# Patient Record
Sex: Male | Born: 1970 | Race: White | Hispanic: No | Marital: Single
Health system: Midwestern US, Community
[De-identification: ages and names within clinical notes are randomized; demographics above are authoritative.]

## PROBLEM LIST (undated history)

## (undated) DIAGNOSIS — R079 Chest pain, unspecified: Secondary | ICD-10-CM

## (undated) DIAGNOSIS — E785 Hyperlipidemia, unspecified: Secondary | ICD-10-CM

## (undated) DIAGNOSIS — J449 Chronic obstructive pulmonary disease, unspecified: Secondary | ICD-10-CM

## (undated) DIAGNOSIS — I10 Essential (primary) hypertension: Secondary | ICD-10-CM

## (undated) DIAGNOSIS — M48061 Spinal stenosis, lumbar region without neurogenic claudication: Secondary | ICD-10-CM

## (undated) DIAGNOSIS — J441 Chronic obstructive pulmonary disease with (acute) exacerbation: Secondary | ICD-10-CM

## (undated) DIAGNOSIS — M5136 Other intervertebral disc degeneration, lumbar region: Secondary | ICD-10-CM

## (undated) DIAGNOSIS — M7989 Other specified soft tissue disorders: Secondary | ICD-10-CM

## (undated) DIAGNOSIS — M5416 Radiculopathy, lumbar region: Secondary | ICD-10-CM

---

## 2014-10-23 ENCOUNTER — Inpatient Hospital Stay: Admit: 2014-10-23 | Primary: Family Medicine

## 2014-10-23 LAB — METABOLIC PANEL, COMPREHENSIVE
A-G Ratio: 1.2 (ref 1.1–2.2)
ALT (SGPT): 32 U/L (ref 12–78)
AST (SGOT): 18 U/L (ref 15–37)
Albumin: 4.3 g/dL (ref 3.5–5.0)
Alk. phosphatase: 86 U/L (ref 45–117)
Anion gap: 7 mmol/L (ref 5–15)
BUN/Creatinine ratio: 15 (ref 12–20)
BUN: 15 MG/DL (ref 6–20)
Bilirubin, total: 0.6 MG/DL (ref 0.2–1.0)
CO2: 24 mmol/L (ref 21–32)
Calcium: 9.4 MG/DL (ref 8.5–10.1)
Chloride: 107 mmol/L (ref 97–108)
Creatinine: 1.02 MG/DL (ref 0.70–1.30)
GFR est AA: >60 mL/min/{1.73_m2} (ref >60)
GFR est non-AA: >60 mL/min/{1.73_m2} (ref >60)
Globulin: 3.5 g/dL (ref 2.0–4.0)
Glucose: 72 mg/dL (ref 65–100)
Potassium: 4.3 mmol/L (ref 3.5–5.1)
Protein, total: 7.8 g/dL (ref 6.4–8.2)
Sodium: 138 mmol/L (ref 136–145)

## 2014-10-23 LAB — CBC WITH AUTOMATED DIFF
ABS. BASOPHILS: 0.1 10*3/uL (ref 0.0–0.1)
ABS. EOSINOPHILS: 0.3 10*3/uL (ref 0.0–0.4)
ABS. LYMPHOCYTES: 2.8 10*3/uL (ref 0.8–3.5)
ABS. MONOCYTES: 0.5 10*3/uL (ref 0.0–1.0)
ABS. NEUTROPHILS: 3.8 10*3/uL (ref 1.8–8.0)
BASOPHILS: 1 % (ref 0–1)
EOSINOPHILS: 4 % (ref 0–7)
HCT: 47.6 % (ref 36.6–50.3)
HGB: 16.1 g/dL (ref 12.1–17.0)
LYMPHOCYTES: 38 % (ref 12–49)
MCH: 32.4 PG (ref 26.0–34.0)
MCHC: 33.8 g/dL (ref 30.0–36.5)
MCV: 95.8 FL (ref 80.0–99.0)
MONOCYTES: 7 % (ref 5–13)
NEUTROPHILS: 50 % (ref 32–75)
PLATELET: 237 10*3/uL (ref 150–400)
RBC: 4.97 M/uL (ref 4.10–5.70)
RDW: 13.2 % (ref 11.5–14.5)
WBC: 7.4 10*3/uL (ref 4.1–11.1)

## 2014-10-23 LAB — LIPID PANEL
CHOL/HDL Ratio: 2.6 (ref 0–5.0)
Cholesterol, total: 118 MG/DL (ref <200)
HDL Cholesterol: 45 MG/DL
LDL, calculated: 60.8 MG/DL (ref 0–100)
Triglyceride: 61 MG/DL (ref <150)
VLDL, calculated: 12.2 MG/DL

## 2014-10-23 LAB — TSH 3RD GENERATION: TSH: 1.31 u[IU]/mL (ref 0.36–3.74)

## 2015-12-23 ENCOUNTER — Encounter

## 2015-12-24 ENCOUNTER — Encounter

## 2016-01-17 ENCOUNTER — Emergency Department: Admit: 2016-01-17 | Payer: Charity | Primary: Family Medicine

## 2016-01-17 ENCOUNTER — Inpatient Hospital Stay
Admit: 2016-01-17 | Discharge: 2016-01-17 | Disposition: A | Discharge status: Home / Self Care | Payer: Charity | Attending: Emergency Medicine

## 2016-01-17 DIAGNOSIS — J441 Chronic obstructive pulmonary disease with (acute) exacerbation: Secondary | ICD-10-CM

## 2016-01-17 LAB — CBC WITH AUTOMATED DIFF
ABS. BASOPHILS: 0.1 10*3/uL (ref 0.0–0.1)
ABS. EOSINOPHILS: 0.5 10*3/uL — ABNORMAL HIGH (ref 0.0–0.4)
ABS. LYMPHOCYTES: 4 10*3/uL — ABNORMAL HIGH (ref 0.8–3.5)
ABS. MONOCYTES: 0.9 10*3/uL (ref 0.0–1.0)
ABS. NEUTROPHILS: 5.3 10*3/uL (ref 1.8–8.0)
BASOPHILS: 1 % (ref 0–1)
EOSINOPHILS: 4 % (ref 0–7)
HCT: 48.3 % (ref 36.6–50.3)
HGB: 16.8 g/dL (ref 12.1–17.0)
LYMPHOCYTES: 37 % (ref 12–49)
MCH: 32.7 PG (ref 26.0–34.0)
MCHC: 34.8 g/dL (ref 30.0–36.5)
MCV: 94.2 FL (ref 80.0–99.0)
MONOCYTES: 8 % (ref 5–13)
NEUTROPHILS: 50 % (ref 32–75)
PLATELET: 200 10*3/uL (ref 150–400)
RBC: 5.13 M/uL (ref 4.10–5.70)
RDW: 13.4 % (ref 11.5–14.5)
WBC: 10.8 10*3/uL (ref 4.1–11.1)

## 2016-01-17 LAB — METABOLIC PANEL, BASIC
Anion gap: 11 mmol/L (ref 5–15)
BUN/Creatinine ratio: 9 — ABNORMAL LOW (ref 12–20)
BUN: 12 MG/DL (ref 6–20)
CO2: 24 mmol/L (ref 21–32)
Calcium: 9 MG/DL (ref 8.5–10.1)
Chloride: 105 mmol/L (ref 97–108)
Creatinine: 1.33 MG/DL — ABNORMAL HIGH (ref 0.70–1.30)
GFR est AA: >60 mL/min/{1.73_m2} (ref >60)
GFR est non-AA: 58 mL/min/{1.73_m2} — ABNORMAL LOW (ref >60)
Glucose: 101 mg/dL — ABNORMAL HIGH (ref 65–100)
Potassium: 3.8 mmol/L (ref 3.5–5.1)
Sodium: 140 mmol/L (ref 136–145)

## 2016-01-17 LAB — TROPONIN I: Troponin-I, Qt.: <0.04 ng/mL (ref <0.05)

## 2016-01-17 MED ORDER — ALBUTEROL SULFATE HFA 90 MCG/ACTUATION AEROSOL INHALER
90 mcg/actuation | RESPIRATORY_TRACT | 0 refills | Status: AC | PRN
Start: 2016-01-17 — End: ?

## 2016-01-17 MED ORDER — AZITHROMYCIN 250 MG TAB
250 mg | ORAL_TABLET | ORAL | 0 refills | Status: AC
Start: 2016-01-17 — End: 2016-01-22

## 2016-01-17 MED ORDER — SODIUM CHLORIDE 0.9 % IJ SYRG
INTRAMUSCULAR | Status: DC | PRN
Start: 2016-01-17 — End: 2016-01-17

## 2016-01-17 MED ORDER — IPRATROPIUM-ALBUTEROL 2.5 MG-0.5 MG/3 ML NEB SOLUTION
2.5 mg-0.5 mg/3 ml | RESPIRATORY_TRACT | Status: AC
Start: 2016-01-17 — End: 2016-01-17
  Administered 2016-01-17: 22:00:00 via RESPIRATORY_TRACT

## 2016-01-17 MED ORDER — PREDNISONE 20 MG TAB
20 mg | ORAL_TABLET | Freq: Every day | ORAL | 0 refills | Status: AC
Start: 2016-01-17 — End: 2016-01-22

## 2016-01-17 MED ORDER — IPRATROPIUM-ALBUTEROL 2.5 MG-0.5 MG/3 ML NEB SOLUTION
2.5 mg-0.5 mg/3 ml | RESPIRATORY_TRACT | Status: AC
Start: 2016-01-17 — End: 2016-01-17
  Administered 2016-01-17: 23:00:00 via RESPIRATORY_TRACT

## 2016-01-17 MED ORDER — SODIUM CHLORIDE 0.9 % IJ SYRG
Freq: Three times a day (TID) | INTRAMUSCULAR | Status: DC
Start: 2016-01-17 — End: 2016-01-17

## 2016-01-17 MED ORDER — METHYLPREDNISOLONE (PF) 125 MG/2 ML IJ SOLR
125 mg/2 mL | INTRAMUSCULAR | Status: AC
Start: 2016-01-17 — End: 2016-01-17
  Administered 2016-01-17: 23:00:00 via INTRAVENOUS

## 2016-01-17 MED FILL — IPRATROPIUM-ALBUTEROL 2.5 MG-0.5 MG/3 ML NEB SOLUTION: 2.5 mg-0.5 mg/3 ml | RESPIRATORY_TRACT | Qty: 3

## 2016-01-17 MED FILL — SOLU-MEDROL (PF) 125 MG/2 ML SOLUTION FOR INJECTION: 125 mg/2 mL | INTRAMUSCULAR | Qty: 2

## 2016-01-17 MED FILL — BD POSIFLUSH NORMAL SALINE 0.9 % INJECTION SYRINGE: INTRAMUSCULAR | Qty: 10

## 2016-01-17 NOTE — ED Provider Notes (Signed)
HPI Comments: 45 y.o. male with no significant past medical history who presents to the ED via EMS with chief complaint of chest pain. Pt reports intermittent chest tightness accompanied by cough and SOB onset about 3 weeks ago. Pt states his chest pain is exacerbated by palpation. Pt states he was seen by his PCP today about starting Chantix and was referred to the ED for further evaluation due to EKG changes. Pt denies any known pulmonary or cardiac hx but says his father has hx of heart disease. Pt denies fever, chills, nausea, or vomiting. There are no other acute medical complaints voiced at this time.    Social Hx: Smoker.     PCP: No primary care provider on file.    Note written by Veto Kemps, Scribe, as dictated by Lauris Chroman, MD 5:20 PM     The history is provided by the patient.        No past medical history on file.    No past surgical history on file.      No family history on file.    Social History     Social History   ??? Marital status: N/A     Spouse name: N/A   ??? Number of children: N/A   ??? Years of education: N/A     Occupational History   ??? Not on file.     Social History Main Topics   ??? Smoking status: Not on file   ??? Smokeless tobacco: Not on file   ??? Alcohol use Not on file   ??? Drug use: Not on file   ??? Sexual activity: Not on file     Other Topics Concern   ??? Not on file     Social History Narrative         ALLERGIES: Review of patient's allergies indicates no known allergies.    Review of Systems   Constitutional: Negative.  Negative for appetite change, chills, fever and unexpected weight change.   HENT: Negative.  Negative for ear pain, hearing loss, nosebleeds, rhinorrhea, sore throat and trouble swallowing.    Respiratory: Positive for cough and shortness of breath.    Cardiovascular: Positive for chest pain. Negative for palpitations.   Gastrointestinal: Negative.  Negative for abdominal distention, abdominal pain, blood in stool, nausea and vomiting.    Endocrine: Negative.    Genitourinary: Negative for dysuria and hematuria.   Musculoskeletal: Negative.  Negative for back pain and myalgias.   Skin: Negative.  Negative for rash.   Allergic/Immunologic: Negative.    Neurological: Negative.  Negative for dizziness, syncope, weakness and numbness.   Hematological: Negative.    Psychiatric/Behavioral: Negative.    All other systems reviewed and are negative.      Vitals:    01/17/16 1708 01/17/16 1715   BP: 132/78 129/86   Pulse: 89 (!) 101   Resp: 18 12   Temp: 98.6 ??F (37 ??C)    SpO2: 98% 99%   Weight: 72.6 kg (160 lb)    Height:  (1.727 m)             Physical Exam   Constitutional: He is oriented to person, place, and time. He appears well-developed and well-nourished. No distress.   HENT:   Head: Normocephalic and atraumatic.   Right Ear: External ear normal.   Left Ear: External ear normal.   Nose: Nose normal.   Mouth/Throat: Oropharynx is clear and moist.   Eyes: Conjunctivae and EOM  are normal. Pupils are equal, round, and reactive to light.   Neck: Normal range of motion. Neck supple. No JVD present. No thyromegaly present.   Cardiovascular: Normal rate, regular rhythm, normal heart sounds and intact distal pulses.    No murmur heard.  Pulmonary/Chest: No accessory muscle usage. He is in respiratory distress. He has wheezes. He has no rales.   Frequent cough.  Mild respiratory distress.  Scant wheezing with forced expiration only.  Good air excursion.  No accessory muscle use.  No rales.   Abdominal: Soft. Bowel sounds are normal. He exhibits no distension. There is no tenderness.   Musculoskeletal: Normal range of motion. He exhibits no edema.   Neurological: He is alert and oriented to person, place, and time. No cranial nerve deficit.   Skin: Skin is warm and dry. No rash noted. Nails show clubbing.   Nails show significant clubbing.   Psychiatric: He has a normal mood and affect. His behavior is normal. Thought content normal.    Nursing note and vitals reviewed.     Note written by Veto KempsSamantha L. Barock, Scribe, as dictated by Lauris ChromanParas K Jesusmanuel Erbes, MD 5:21 PM    MDM  ED Course       Procedures    ED EKG interpretation:  Rhythm: normal sinus rhythm; and regular . Rate (approx.): 83; Axis: normal; ST/T wave: normal; no ectopy.      Note written by Veto KempsSamantha L. Barock, Scribe, as dictated by Lauris ChromanParas K Karlen Barbar, MD 5:09 PM    PROGRESS NOTE:  6:31 PM   Troponin negative. Chemistry unremarkable. CBC normal. Chest x-ray findings consistent with COPD. No airspace disease.     A/P: COPD exacerbation. Will discharge home with inhalers, steroids, and Z pack.

## 2016-01-17 NOTE — ED Notes (Signed)
Patient stated "i thought i had pneumonia", c/o SOB.

## 2016-01-17 NOTE — ED Notes (Signed)
Discharged by Provider.

## 2016-01-17 NOTE — ED Triage Notes (Signed)
Pt arrives via EMS with chest pain and coughing x2-3 weeks. Reports "I feel like something is sitting on my chest." 4 baby asa, 1 atrovent, and 2 albuterol given PTA by EMS.

## 2016-05-19 ENCOUNTER — Inpatient Hospital Stay: Admit: 2016-05-19 | Payer: Charity | Primary: Family Medicine

## 2016-05-19 LAB — LIPID PANEL
CHOL/HDL Ratio: 4.2 (ref 0–5.0)
Cholesterol, total: 144 MG/DL (ref <200)
HDL Cholesterol: 34 MG/DL
LDL, calculated: 65.8 MG/DL (ref 0–100)
Triglyceride: 221 MG/DL — ABNORMAL HIGH (ref <150)
VLDL, calculated: 44.2 MG/DL

## 2016-05-19 LAB — METABOLIC PANEL, COMPREHENSIVE
A-G Ratio: 1.1 (ref 1.1–2.2)
ALT (SGPT): 34 U/L (ref 12–78)
AST (SGOT): 14 U/L — ABNORMAL LOW (ref 15–37)
Albumin: 3.7 g/dL (ref 3.5–5.0)
Alk. phosphatase: 90 U/L (ref 45–117)
Anion gap: 10 mmol/L (ref 5–15)
BUN/Creatinine ratio: 9 — ABNORMAL LOW (ref 12–20)
BUN: 10 MG/DL (ref 6–20)
Bilirubin, total: 0.3 MG/DL (ref 0.2–1.0)
CO2: 23 mmol/L (ref 21–32)
Calcium: 9.1 MG/DL (ref 8.5–10.1)
Chloride: 106 mmol/L (ref 97–108)
Creatinine: 1.09 MG/DL (ref 0.70–1.30)
GFR est AA: >60 mL/min/{1.73_m2} (ref >60)
GFR est non-AA: >60 mL/min/{1.73_m2} (ref >60)
Globulin: 3.5 g/dL (ref 2.0–4.0)
Glucose: 116 mg/dL — ABNORMAL HIGH (ref 65–100)
Potassium: 4.1 mmol/L (ref 3.5–5.1)
Protein, total: 7.2 g/dL (ref 6.4–8.2)
Sodium: 139 mmol/L (ref 136–145)

## 2016-07-20 ENCOUNTER — Encounter

## 2017-06-01 ENCOUNTER — Inpatient Hospital Stay: Admit: 2017-06-01 | Payer: Self-pay | Primary: Family Medicine

## 2017-06-01 LAB — METABOLIC PANEL, COMPREHENSIVE
A-G Ratio: 1 — ABNORMAL LOW (ref 1.1–2.2)
ALT (SGPT): 81 U/L — ABNORMAL HIGH (ref 12–78)
AST (SGOT): 32 U/L (ref 15–37)
Albumin: 3.8 g/dL (ref 3.5–5.0)
Alk. phosphatase: 101 U/L (ref 45–117)
Anion gap: 9 mmol/L (ref 5–15)
BUN/Creatinine ratio: 5 — ABNORMAL LOW (ref 12–20)
BUN: 6 MG/DL (ref 6–20)
Bilirubin, total: 0.6 MG/DL (ref 0.2–1.0)
CO2: 25 mmol/L (ref 21–32)
Calcium: 8.9 MG/DL (ref 8.5–10.1)
Chloride: 107 mmol/L (ref 97–108)
Creatinine: 1.1 MG/DL (ref 0.70–1.30)
GFR est AA: >60 mL/min/{1.73_m2} (ref >60)
GFR est non-AA: >60 mL/min/{1.73_m2} (ref >60)
Globulin: 4 g/dL (ref 2.0–4.0)
Glucose: 90 mg/dL (ref 65–100)
Potassium: 4.5 mmol/L (ref 3.5–5.1)
Protein, total: 7.8 g/dL (ref 6.4–8.2)
Sodium: 141 mmol/L (ref 136–145)

## 2017-06-01 LAB — CBC WITH AUTOMATED DIFF
ABS. BASOPHILS: 0.1 10*3/uL (ref 0.0–0.1)
ABS. EOSINOPHILS: 0.4 10*3/uL (ref 0.0–0.4)
ABS. IMM. GRANS.: 0 10*3/uL (ref 0.00–0.04)
ABS. LYMPHOCYTES: 3 10*3/uL (ref 0.8–3.5)
ABS. MONOCYTES: 0.6 10*3/uL (ref 0.0–1.0)
ABS. NEUTROPHILS: 3.7 10*3/uL (ref 1.8–8.0)
ABSOLUTE NRBC: 0 10*3/uL (ref 0.00–0.01)
BASOPHILS: 1 % (ref 0–1)
EOSINOPHILS: 5 % (ref 0–7)
HCT: 53.6 % — ABNORMAL HIGH (ref 36.6–50.3)
HGB: 18 g/dL — ABNORMAL HIGH (ref 12.1–17.0)
IMMATURE GRANULOCYTES: 0 % (ref 0.0–0.5)
LYMPHOCYTES: 38 % (ref 12–49)
MCH: 32.7 PG (ref 26.0–34.0)
MCHC: 33.6 g/dL (ref 30.0–36.5)
MCV: 97.5 FL (ref 80.0–99.0)
MONOCYTES: 7 % (ref 5–13)
MPV: 12.3 FL (ref 8.9–12.9)
NEUTROPHILS: 48 % (ref 32–75)
NRBC: 0 PER 100 WBC
PLATELET: 180 10*3/uL (ref 150–400)
RBC: 5.5 M/uL (ref 4.10–5.70)
RDW: 13.3 % (ref 11.5–14.5)
WBC: 7.7 10*3/uL (ref 4.1–11.1)

## 2017-06-01 LAB — LIPID PANEL
CHOL/HDL Ratio: 5.5 — ABNORMAL HIGH (ref 0–5.0)
Cholesterol, total: 192 MG/DL (ref <200)
HDL Cholesterol: 35 MG/DL
LDL, calculated: 114.8 MG/DL — ABNORMAL HIGH (ref 0–100)
Triglyceride: 211 MG/DL — ABNORMAL HIGH (ref <150)
VLDL, calculated: 42.2 MG/DL

## 2017-06-01 LAB — VITAMIN D, 25 HYDROXY: Vitamin D 25-Hydroxy: 13.6 ng/mL — ABNORMAL LOW (ref 30–100)

## 2017-06-01 LAB — HEMOGLOBIN A1C WITH EAG
Est. average glucose: 120 mg/dL
Hemoglobin A1c: 5.8 % (ref 4.2–6.3)

## 2017-06-01 LAB — TSH 3RD GENERATION: TSH: 2.38 u[IU]/mL (ref 0.36–3.74)

## 2017-07-12 ENCOUNTER — Inpatient Hospital Stay: Admit: 2017-07-13 | Payer: Self-pay | Primary: Family Medicine

## 2017-07-13 LAB — METABOLIC PANEL, COMPREHENSIVE
A-G Ratio: 1 — ABNORMAL LOW (ref 1.1–2.2)
ALT (SGPT): 77 U/L (ref 12–78)
AST (SGOT): 26 U/L (ref 15–37)
Albumin: 3.6 g/dL (ref 3.5–5.0)
Alk. phosphatase: 95 U/L (ref 45–117)
Anion gap: 7 mmol/L (ref 5–15)
BUN/Creatinine ratio: 9 — ABNORMAL LOW (ref 12–20)
BUN: 9 MG/DL (ref 6–20)
Bilirubin, total: 0.4 MG/DL (ref 0.2–1.0)
CO2: 25 mmol/L (ref 21–32)
Calcium: 9 MG/DL (ref 8.5–10.1)
Chloride: 109 mmol/L — ABNORMAL HIGH (ref 97–108)
Creatinine: 1.03 MG/DL (ref 0.70–1.30)
GFR est AA: >60 mL/min/{1.73_m2} (ref >60)
GFR est non-AA: >60 mL/min/{1.73_m2} (ref >60)
Globulin: 3.7 g/dL (ref 2.0–4.0)
Glucose: 109 mg/dL — ABNORMAL HIGH (ref 65–100)
Potassium: 4 mmol/L (ref 3.5–5.1)
Protein, total: 7.3 g/dL (ref 6.4–8.2)
Sodium: 141 mmol/L (ref 136–145)

## 2017-07-13 LAB — CBC WITH AUTOMATED DIFF
ABS. BASOPHILS: 0.1 10*3/uL (ref 0.0–0.1)
ABS. EOSINOPHILS: 0.3 10*3/uL (ref 0.0–0.4)
ABS. IMM. GRANS.: 0 10*3/uL (ref 0.00–0.04)
ABS. LYMPHOCYTES: 3.5 10*3/uL (ref 0.8–3.5)
ABS. MONOCYTES: 0.9 10*3/uL (ref 0.0–1.0)
ABS. NEUTROPHILS: 4.6 10*3/uL (ref 1.8–8.0)
ABSOLUTE NRBC: 0.02 10*3/uL — ABNORMAL HIGH (ref 0.00–0.01)
BASOPHILS: 1 % (ref 0–1)
EOSINOPHILS: 3 % (ref 0–7)
HCT: 53.9 % — ABNORMAL HIGH (ref 36.6–50.3)
HGB: 17.8 g/dL — ABNORMAL HIGH (ref 12.1–17.0)
IMMATURE GRANULOCYTES: 0 % (ref 0.0–0.5)
LYMPHOCYTES: 37 % (ref 12–49)
MCH: 32.2 PG (ref 26.0–34.0)
MCHC: 33 g/dL (ref 30.0–36.5)
MCV: 97.5 FL (ref 80.0–99.0)
MONOCYTES: 10 % (ref 5–13)
MPV: 12.6 FL (ref 8.9–12.9)
NEUTROPHILS: 49 % (ref 32–75)
NRBC: 0.2 PER 100 WBC — ABNORMAL HIGH
PLATELET: 200 10*3/uL (ref 150–400)
RBC: 5.53 M/uL (ref 4.10–5.70)
RDW: 13.4 % (ref 11.5–14.5)
WBC: 9.6 10*3/uL (ref 4.1–11.1)

## 2017-07-13 LAB — FERRITIN: Ferritin: 62 NG/ML (ref 26–388)

## 2017-07-14 LAB — ERYTHROPOIETIN: Erythropoietin: 10.2 m[IU]/mL (ref 2.6–18.5)

## 2017-11-09 ENCOUNTER — Ambulatory Visit
Admit: 2017-11-09 | Discharge: 2017-11-09 | Payer: PRIVATE HEALTH INSURANCE | Attending: Family Medicine | Primary: Family Medicine

## 2017-11-09 DIAGNOSIS — E7800 Pure hypercholesterolemia, unspecified: Secondary | ICD-10-CM

## 2017-11-09 NOTE — Progress Notes (Signed)
Subjective:      Geoffrey Murphy is a 47 y.o. male with h/o hyperlipidemia, COPD, GERD, chronic low back pain, and depression here to establish care. Transferring care from [facility]. He has lab form with requested labs.     COPD: managed by Pulmonology and next appointment 12/18/17. No recent ED visits or hospitalizations. Reports compliance with medical therapy.     Vitamin D deficiency: has completed high dose supplementation about 1 month ago. He has not been taking a daily supplement.   GERD: stable with use of omeprazole PRN. Denies hematemesis, melena, hematochezia.   Exertional chest pain: has noticed episodes over the last several months. Improved with rest and will resolve within 2-3 minutes. Pain is located in the left chest and is described as "someone sticking something in my heart", sharp in quality. Non-radiating. Episodes not associated with lightheadedness, dizziness, nausea, vomiting, diaphoresis. Episodes are increasing in frequency. Family h/o notable for MI in his father. He has h/o high cholesterol and currently on statin therapy.     Patient Care Team:  Other, Phys, MD as PCP - General  Concha Pyo, MD as Physician (Psychiatry)  McFarling, Otis Dials, NP as Nurse Practitioner (Pulmonary Disease)  Ludwig Lean, MD as Physician (Orthopedic Surgery)      Current Outpatient Medications   Medication Sig Dispense Refill   ??? albuterol (PROVENTIL VENTOLIN) 2.5 mg /3 mL (0.083 %) nebulizer solution Take 3 mL by inhalation two (2) times daily as needed.     ??? omeprazole (PRILOSEC) 20 mg capsule Take 1 Cap by mouth daily.     ??? buPROPion SR (WELLBUTRIN SR) 150 mg SR tablet Take 1 Tab by mouth two (2) times a day.     ??? atorvastatin (LIPITOR) 10 mg tablet Take 1 Tab by mouth daily.     ??? gabapentin (NEURONTIN) 300 mg capsule Take 1 Cap by mouth three (3) times daily.     ??? methocarbamol (ROBAXIN) 500 mg tablet Take 1.5 Tabs by mouth two (2) times daily as needed.      ??? mirtazapine (REMERON) 15 mg tablet Take 1 Tab by mouth daily.     ??? meloxicam (MOBIC) 15 mg tablet Take 1 Tab by mouth daily.     ??? ergocalciferol (VITAMIN D2) 50,000 unit capsule Take 50,000 Units by mouth every seven (7) days.     ??? aspirin delayed-release 81 mg tablet Take 81 mg by mouth daily.     ??? albuterol (VENTOLIN HFA) 90 mcg/actuation inhaler Take 2 Puffs by inhalation every six (6) hours as needed for Wheezing.     ??? tiotropium-olodaterol (STIOLTO RESPIMAT) 2.5-2.5 mcg/actuation inhaler Take 1 Puff by inhalation daily.     ??? albuterol (PROAIR HFA) 90 mcg/actuation inhaler Take 2 Puffs by inhalation every four (4) hours as needed for Wheezing. 1 Inhaler 0       No Known Allergies    Past medical history - reviewed.   Past Medical History:   Diagnosis Date   ??? Chronic obstructive pulmonary disease ([place])    ??? Chronic pain     back pain   ??? Depression    ??? GERD (gastroesophageal reflux disease)    ??? Hypercholesterolemia        Social history - reviewed.   Social History     Tobacco Use   ??? Smoking status: Former Smoker     Types: Cigarettes     Last attempt to quit: 09/12/2016     Years since  quitting: 1.1   ??? Smokeless tobacco: Never Used   Substance Use Topics   ??? Alcohol use: Yes     Frequency: Monthly or less     Drinks per session: 1 or 2     Binge frequency: Never        Family history - reviewed.   Family History   Problem Relation Age of Onset   ??? Hypertension Mother    ??? Heart Attack Father        Review of Systems, in addition to HPI;   Constitutional: negative for fevers and chills  Eyes: negative for visual disturbance and irritation  Ears, nose, mouth, throat, and face: negative for nasal congestion and sore throat  Gastrointestinal: negative for nausea, vomiting, change in bowel habits and abdominal pain  Genitourinary:negative for frequency, dysuria and hematuria  Musculoskeletal: positive for arthralgias  Neurological: negative for headaches, dizziness       Objective:     Vitals:     11/09/17 1058 11/09/17 1158   BP: (!) 142/98  Comment: Manual (!) 128/92   BP 1 Location: Right arm Right arm   BP Patient Position: Sitting Sitting   Pulse: 83    Resp: 18    Temp: 98.2 ??F (36.8 ??C)  Comment: .    TempSrc: Oral    SpO2: 96%    Weight: 185 lb (83.9 kg)    Height: '5\' 8"'  (1.727 m)         General appearance - alert, well appearing, and in no distress  Eyes - pupils equal and reactive, extraocular eye movements intact, sclera anicteric  Oropharyngx - mucous membranes moist, pharynx normal without lesions  Neck - supple, no significant adenopathy, carotids upstroke normal bilaterally, no bruits  Chest - wheezing noted, no rales or rhonchi, symmetric air entry, no tachypnea, retractions or cyanosis  Heart - normal rate, regular rhythm, normal S1, S2, no murmurs, rubs, clicks or gallops  Extremities - peripheral pulses normal, no pedal edema, no clubbing or cyanosis  Neurologic - alert, oriented, normal speech, no focal findings or movement disorder noted  Skin - normal coloration and turgor, no rashes  Mental Status - alert, oriented to person, place, and time, normal mood, behavior, speech, dress, motor activity, and thought processes    Lab Results   Component Value Date/Time    Cholesterol, total 192 05/31/2017 11:55 AM    HDL Cholesterol 35 05/31/2017 11:55 AM    LDL, calculated 114.8 (H) 05/31/2017 11:55 AM    VLDL, calculated 42.2 05/31/2017 11:55 AM    Triglyceride 211 (H) 05/31/2017 11:55 AM    CHOL/HDL Ratio 5.5 (H) 05/31/2017 11:55 AM       Lab Results   Component Value Date/Time    Sodium 141 07/12/2017 12:00 PM    Potassium 4.0 07/12/2017 12:00 PM    Chloride 109 (H) 07/12/2017 12:00 PM    CO2 25 07/12/2017 12:00 PM    Anion gap 7 07/12/2017 12:00 PM    Glucose 109 (H) 07/12/2017 12:00 PM    BUN 9 07/12/2017 12:00 PM    Creatinine 1.03 07/12/2017 12:00 PM    BUN/Creatinine ratio 9 (L) 07/12/2017 12:00 PM    GFR est AA >60 07/12/2017 12:00 PM    GFR est non-AA >60 07/12/2017 12:00 PM     Calcium 9.0 07/12/2017 12:00 PM    Bilirubin, total 0.4 07/12/2017 12:00 PM    AST (SGOT) 26 07/12/2017 12:00 PM    Alk. phosphatase 95 07/12/2017  12:00 PM    Protein, total 7.3 07/12/2017 12:00 PM    Albumin 3.6 07/12/2017 12:00 PM    Globulin 3.7 07/12/2017 12:00 PM    A-G Ratio 1.0 (L) 07/12/2017 12:00 PM    ALT (SGPT) 77 07/12/2017 12:00 PM       Lab Results   Component Value Date/Time    WBC 9.6 07/12/2017 12:00 PM    HGB 17.8 (H) 07/12/2017 12:00 PM    HCT 53.9 (H) 07/12/2017 12:00 PM    PLATELET 200 07/12/2017 12:00 PM    MCV 97.5 07/12/2017 12:00 PM       Lab Results   Component Value Date/Time    Vitamin D 25-Hydroxy 13.6 (L) 05/31/2017 11:55 AM       Assessment/Plan:   Geoffrey Murphy is a 47 y.o. male seen for:     1. Hypercholesterolemia: check labs. Continue with statin therapy.   - LIPID PANEL  - HEMOGLOBIN A1C WITH EAG  - METABOLIC PANEL, COMPREHENSIVE    2. Gastroesophageal reflux disease, esophagitis presence not specified: controlled, continue with PRN use of omeprazole.     3. Chronic obstructive pulmonary disease, unspecified COPD type [facility]): managed by Pulmonology.   - CBC WITH AUTOMATED DIFF    4. Exertional chest pain: check labs. Given history and family h/o CAD, recommend cardiology evaluation.   - CBC WITH AUTOMATED DIFF  - TSH AND FREE T4  - REFERRAL TO CARDIOLOGY    5. BMI 28.0-28.9,adult: I have reviewed/discussed the above normal BMI with the patient.  I have recommended the following interventions: dietary management education, guidance, and counseling and encourage exercise.     6. Vitamin D deficiency: s/p high dose supplementation. Check level.   - VITAMIN D, 25 HYDROXY    7. Encounter to establish care: previous medical records requested.     I have discussed the diagnosis with the patient and the intended plan as seen in the above orders. The patient has received an after-visit summary and questions were answered concerning future plans. I have discussed  medication side effects and warnings with the patient as well. Patient verbalizes understanding of plan of care and denies further questions or concerns at this time. Informed patient to return to the office if symptoms worsen or if new symptoms arise.       Follow-up and Dispositions    ?? Return in about 4 months (around 03/11/2018) for follow up or sooner as needed.

## 2017-11-09 NOTE — Patient Instructions (Signed)
A Healthy Lifestyle: Care Instructions  Your Care Instructions    A healthy lifestyle can help you feel good, stay at a healthy weight, and have plenty of energy for both work and play. A healthy lifestyle is something you can share with your whole family.  A healthy lifestyle also can lower your risk for serious health problems, such as high blood pressure, heart disease, and diabetes.  You can follow a few steps listed below to improve your health and the health of your family.  Follow-up care is a key part of your treatment and safety. Be sure to make and go to all appointments, and call your doctor if you are having problems. It's also a good idea to know your test results and keep a list of the medicines you take.  How can you care for yourself at home?  ?? Do not eat too much sugar, fat, or fast foods. You can still have dessert and treats now and then. The goal is moderation.  ?? Start small to improve your eating habits. Pay attention to portion sizes, drink less juice and soda pop, and eat more fruits and vegetables.  ? Eat a healthy amount of food. A 3-ounce serving of meat, for example, is about the size of a deck of cards. Fill the rest of your plate with vegetables and whole grains.  ? Limit the amount of soda and sports drinks you have every day. Drink more water when you are thirsty.  ? Eat at least 5 servings of fruits and vegetables every day. It may seem like a lot, but it is not hard to reach this goal. A serving or helping is 1 piece of fruit, 1 cup of vegetables, or 2 cups of leafy, raw vegetables. Have an apple or some carrot sticks as an afternoon snack instead of a candy bar. Try to have fruits and/or vegetables at every meal.  ?? Make exercise part of your daily routine. You may want to start with simple activities, such as walking, bicycling, or slow swimming. Try to be active 30 to 60 minutes every day. You do not need to do all 30 to 60  minutes all at once. For example, you can exercise 3 times a day for 10 or 20 minutes. Moderate exercise is safe for most people, but it is always a good idea to talk to your doctor before starting an exercise program.  ?? Keep moving. Mow the lawn, work in the garden, or clean your house. Take the stairs instead of the elevator at work.  ?? If you smoke, quit. People who smoke have an increased risk for heart attack, stroke, cancer, and other lung illnesses. Quitting is hard, but there are ways to boost your chance of quitting tobacco for good.  ? Use nicotine gum, patches, or lozenges.  ? Ask your doctor about stop-smoking programs and medicines.  ? Keep trying.  In addition to reducing your risk of diseases in the future, you will notice some benefits soon after you stop using tobacco. If you have shortness of breath or asthma symptoms, they will likely get better within a few weeks after you quit.  ?? Limit how much alcohol you drink. Moderate amounts of alcohol (up to 2 drinks a day for men, 1 drink a day for women) are okay. But drinking too much can lead to liver problems, high blood pressure, and other health problems.  Family health  If you have a family, there are many things you   can do together to improve your health.  ?? Eat meals together as a family as often as possible.  ?? Eat healthy foods. This includes fruits, vegetables, lean meats and dairy, and whole grains.  ?? Include your family in your fitness plan. Most people think of activities such as jogging or tennis as the way to fitness, but there are many ways you and your family can be more active. Anything that makes you breathe hard and gets your heart pumping is exercise. Here are some tips:  ? Walk to do errands or to take your child to school or the bus.  ? Go for a family bike ride after dinner instead of watching TV.  Where can you learn more?  Go to http://www.healthwise.net/[employer].   Enter U807 in the search box to learn more about "A Healthy Lifestyle: Care Instructions."  Current as of: April 17, 2017  Content Version: 11.9  ?? 2006-2018 [employer]. Care instructions adapted under license by [employer] (which disclaims liability or warranty for this information). If you have questions about a medical condition or this instruction, always ask your healthcare professional. Healthwise, Inc[employer] disclaims any warranty or liability for your use of this information.

## 2017-11-09 NOTE — Progress Notes (Signed)
Identified pt with two pt identifiers(name and DOB).    Chief Complaint   Patient presents with   ??? Establish Care     Former PCP [facility].    ??? Labs     Patient was [facility]. would like to have them done here now.         Health Maintenance Due   Topic   ??? DTaP/Tdap/Td series (1 - Tdap)       Wt Readings from Last 3 Encounters:   11/09/17 185 lb (83.9 kg)   01/17/16 160 lb (72.6 kg)     Temp Readings from Last 3 Encounters:   11/09/17 98.2 ??F (36.8 ??C) (Oral)   01/17/16 98.6 ??F (37 ??C)     BP Readings from Last 3 Encounters:   11/09/17 (!) 128/92   01/17/16 122/62     Pulse Readings from Last 3 Encounters:   11/09/17 83   01/17/16 84         Learning Assessment:  :     Learning Assessment 11/09/2017   PRIMARY LEARNER Patient   PRIMARY LANGUAGE ENGLISH   LEARNER PREFERENCE PRIMARY DEMONSTRATION   ANSWERED BY self   RELATIONSHIP SELF       Depression Screening:  :     3 most recent PHQ Screens 11/09/2017   Little interest or pleasure in doing things Not at all   Feeling down, depressed, irritable, or hopeless Not at all   Total Score PHQ 2 0       Fall Risk Assessment:  :     No flowsheet data found.    Abuse Screening:  :     Abuse Screening Questionnaire 11/09/2017   Do you ever feel afraid of your partner? N   Are you in a relationship with someone who physically or mentally threatens you? N   Is it safe for you to go home? Y       Coordination of Care Questionnaire:  :     1) Have you been to an emergency room, urgent care clinic since your last visit? no   Hospitalized since your last visit? no             2) Have you seen or consulted any other health care providers outside of [facility] since your last visit? yes  (Include any pap smears or colon screenings in this section.)    3) Do you have an Advance Directive on file? no  Are you interested in receiving information about Advance Directives? no     Reviewed record in preparation for visit and have obtained necessary documentation.  Medication reconciliation up to date and corrected with patient at this time.

## 2017-11-11 LAB — CBC WITH AUTOMATED DIFF
ABS. BASOPHILS: 0.1 10*3/uL (ref 0.0–0.2)
ABS. EOSINOPHILS: 0.4 10*3/uL (ref 0.0–0.4)
ABS. IMM. GRANS.: 0 10*3/uL (ref 0.0–0.1)
ABS. MONOCYTES: 0.9 10*3/uL (ref 0.1–0.9)
ABS. NEUTROPHILS: 5.5 10*3/uL (ref 1.4–7.0)
Abs Lymphocytes: 3.4 10*3/uL — ABNORMAL HIGH (ref 0.7–3.1)
BASOPHILS: 1 %
EOSINOPHILS: 4 %
HCT: 50.4 % (ref 37.5–51.0)
HGB: 17.4 g/dL (ref 13.0–17.7)
IMMATURE GRANULOCYTES: 0 %
Lymphocytes: 33 %
MCH: 33.2 pg — ABNORMAL HIGH (ref 26.6–33.0)
MCHC: 34.5 g/dL (ref 31.5–35.7)
MCV: 96 fL (ref 79–97)
MONOCYTES: 8 %
NEUTROPHILS: 54 %
PLATELET: 231 10*3/uL (ref 150–379)
RBC: 5.24 x10E6/uL (ref 4.14–5.80)
RDW: 14 % (ref 12.3–15.4)
WBC: 10.2 10*3/uL (ref 3.4–10.8)

## 2017-11-11 LAB — HEMOGLOBIN A1C WITH EAG
Estimated average glucose: 123 mg/dL
Hemoglobin A1c: 5.9 % — ABNORMAL HIGH (ref 4.8–5.6)

## 2017-11-11 LAB — METABOLIC PANEL, COMPREHENSIVE
A-G Ratio: 1.5 (ref 1.2–2.2)
ALT (SGPT): 84 IU/L — ABNORMAL HIGH (ref 0–44)
AST (SGOT): 35 IU/L (ref 0–40)
Albumin: 4.4 g/dL (ref 3.5–5.5)
Alk. phosphatase: 90 IU/L (ref 39–117)
BUN/Creatinine ratio: 8 — ABNORMAL LOW (ref 9–20)
BUN: 7 mg/dL (ref 6–24)
Bilirubin, total: 0.4 mg/dL (ref 0.0–1.2)
CO2: 20 mmol/L (ref 20–29)
Calcium: 9.5 mg/dL (ref 8.7–10.2)
Chloride: 102 mmol/L (ref 96–106)
Creatinine: 0.87 mg/dL (ref 0.76–1.27)
GFR est AA: 119 mL/min/{1.73_m2} (ref >59)
GFR est non-AA: 103 mL/min/{1.73_m2} (ref >59)
GLOBULIN, TOTAL: 3 g/dL (ref 1.5–4.5)
Glucose: 84 mg/dL (ref 65–99)
Potassium: 4.6 mmol/L (ref 3.5–5.2)
Protein, total: 7.4 g/dL (ref 6.0–8.5)
Sodium: 140 mmol/L (ref 134–144)

## 2017-11-11 LAB — TSH AND FREE T4
T4, Free: 1.26 ng/dL (ref 0.82–1.77)
TSH: 1.57 u[IU]/mL (ref 0.450–4.500)

## 2017-11-11 LAB — LIPID PANEL
Cholesterol, total: 177 mg/dL (ref 100–199)
HDL Cholesterol: 31 mg/dL — ABNORMAL LOW (ref >39)
LDL, calculated: 98 mg/dL (ref 0–99)
Triglyceride: 238 mg/dL — ABNORMAL HIGH (ref 0–149)
VLDL, calculated: 48 mg/dL — ABNORMAL HIGH (ref 5–40)

## 2017-11-11 LAB — VITAMIN D, 25 HYDROXY: VITAMIN D, 25-HYDROXY: 13.5 ng/mL — ABNORMAL LOW (ref 30.0–100.0)

## 2017-11-11 LAB — CVD REPORT

## 2017-11-12 ENCOUNTER — Encounter: Attending: Internal Medicine | Primary: Family Medicine

## 2017-11-29 ENCOUNTER — Ambulatory Visit
Admit: 2017-11-29 | Discharge: 2017-11-29 | Payer: PRIVATE HEALTH INSURANCE | Attending: Internal Medicine | Primary: Family Medicine

## 2017-11-29 DIAGNOSIS — R079 Chest pain, unspecified: Secondary | ICD-10-CM

## 2017-11-29 NOTE — Progress Notes (Signed)
Orders for echo, lexiscan/cardiolite per Dr. Leron CroakPrabhakar's VO.  Dx: CP on exertion, DOE  Patient was advised to take all his inhalers/pulmonary medications prior to the stress test.

## 2017-11-29 NOTE — Progress Notes (Signed)
Geoffrey Few, MD    [address] [place], [place] [address]    Office [phone]  Pager [phone]    Geoffrey Murphy is a 47 y.o. male referred for evaluation of chest pain. Consult requested by Dara Lords, MD      Primary care physician:  Dara Lords, MD      Chief complaint:  Geoffrey Murphy is a 47 y.o. male who complains of   Chief Complaint   Patient presents with   ??? New Patient   ??? Chest Pain (Angina)     on exertion         I had the pleasure of seeing Geoffrey Murphy in the office today.        Assessment:  Chest pain.  Dyspnea  Hyperlipidemia  Anxiety depression  History of COPD       Plan:   Lexi nuclear study.  Patient was advised to take all his inhalers/pulmonary medications prior to the stress test.  Echocardiogram.  Currently not on blood pressure medications.  Blood pressure mildly elevated.  Could be secondary to his underlying chronic pain.  Followed by pulmonary associates.  Lipids 11/09/2017 reviewed.  Aggressive cardia vascular risk factor modification.  Follow-up in 4 to 6 weeks or earlier as needed.    Patient understands the plan. All questions were answered to the patient's satisfaction.    Medication Side Effects and Warnings were discussed with patient: yes  Patient Labs were reviewed and or requested:  yes  Patient Past Records were reviewed and or requested: yes    I appreciate the opportunity to be involved in [facility]. Please see note below for details. Please do not hesitate to contact us with questions or concerns.    Geoffrey Few, MD    Cardiac Testing/ Procedures:    A.Cardiac Cath/PCI:    B.ECHO/TEE:    C.StressNuclear/Stress ECHO/Stress test:    D.Vascular:    E. EP:    F. Miscellaneous:    History of present illness:  47 yr old CM with history of COPD, chronic back pain who has been having intermittent chest tightness.  Usually associated with shortness of breath  and coughing probably secondary to his COPD.  No prior history of CAD.  Mild swelling lower extremities.  Has bilateral leg pain secondary to his chronic back pain/disc disease/nerve injury.    Past Medical History:   Diagnosis Date   ??? Chronic obstructive pulmonary disease (HCC)    ??? Chronic pain     back pain   ??? Depression    ??? GERD (gastroesophageal reflux disease)    ??? Hypercholesterolemia       No past surgical history on file.  Family History   Problem Relation Age of Onset   ??? Hypertension Mother    ??? Heart Attack Father       Social History     Tobacco Use   ??? Smoking status: Former Smoker     Types: Cigarettes     Last attempt to quit: 09/12/2016     Years since quitting: 1.2   ??? Smokeless tobacco: Never Used   Substance Use Topics   ??? Alcohol use: Yes     Frequency: Monthly or less     Drinks per session: 1 or 2     Binge frequency: Never   ??? Drug use: Never          Medications before admission:  Current Outpatient Medications   Medication Sig Dispense   ??? albuterol (PROVENTIL VENTOLIN) 2.5 mg /3 mL (0.083 %) nebulizer solution Take 3 mL by inhalation two (2) times daily as needed.    ??? omeprazole (PRILOSEC) 20 mg capsule Take 1 Cap by mouth daily.    ??? buPROPion SR (WELLBUTRIN SR) 150 mg SR tablet Take 1 Tab by mouth two (2) times a day.    ??? atorvastatin (LIPITOR) 10 mg tablet Take 1 Tab by mouth daily.    ??? gabapentin (NEURONTIN) 300 mg capsule Take 1 Cap by mouth three (3) times daily.    ??? methocarbamol (ROBAXIN) 500 mg tablet Take 1.5 Tabs by mouth two (2) times daily as needed.    ??? mirtazapine (REMERON) 15 mg tablet Take 1 Tab by mouth daily.    ??? meloxicam (MOBIC) 15 mg tablet Take 1 Tab by mouth daily.    ??? aspirin delayed-release 81 mg tablet Take 81 mg by mouth daily.    ??? tiotropium-olodaterol (STIOLTO RESPIMAT) 2.5-2.5 mcg/actuation inhaler Take 1 Puff by inhalation daily.    ??? albuterol (PROAIR HFA) 90 mcg/actuation inhaler Take 2 Puffs by  inhalation every four (4) hours as needed for Wheezing. 1 Inhaler   ??? ergocalciferol (VITAMIN D2) 50,000 unit capsule Take 50,000 Units by mouth every seven (7) days.      No current facility-administered medications for this visit.            Review of Systems:  (bold if positive, if negative)    Gen:  Eyes:  ENT:  CVS:  Pulm:  GI:    GU:    MS:   arthritisSkin:  Psych:  , anxietyEndo:    Hem:  Renal:    Neuro:        Physical Exam:  Visit Vitals  BP 142/88 (BP 1 Location: Right arm, BP Patient Position: Sitting)   Pulse 90   Resp 16   Ht 5\' 8"  (1.727 m)   Wt 195 lb (88.5 kg)   SpO2 96%   BMI 29.65 kg/m??          Gen: Well-developed, well-nourished, in no acute distress, uses a cane to walk.  HEENT:  Pink conjunctivae, hearing intact to voice, moist mucous membranes  Neck: Supple,No JVD, No Carotid Bruit, Thyroid- non tender  Resp: No accessory muscle use, Clear breath sounds, No rales or rhonchi  Card: Regular Rate,Rythm,Normal S1, S2, No murmurs, rubs or gallop. No thrills.   Abd:  Soft, non-tender, non-distended, normoactive bowel sounds are present,   MSK: No cyanosis or clubbing  Skin: No rashes or ulcers  Neuro:   moving all four extremities, no focal deficit, follows commands appropriately  Psych:  Good insight, oriented to person, place and time, alert, Nml Affect  LE: No edema  Vascular: Radial Pulses 2+ and symmetric        EKG: Sinus rhythm, normal axis, PR WP      Cxray:    LABS:    No results for input(s): CPK, TROIQ in the last 72 hours.    No lab exists for component: CKQMB, CPKMB    Lab Results   Component Value Date/Time    WBC 10.2 11/09/2017 12:06 PM    HGB 17.4 11/09/2017 12:06 PM    HCT 50.4 11/09/2017 12:06 PM    PLATELET 231 11/09/2017 12:06 PM    MCV 96 11/09/2017 12:06 PM     Lab Results   Component Value Date/Time  Sodium 140 11/09/2017 12:06 PM    Potassium 4.6 11/09/2017 12:06 PM    Chloride 102 11/09/2017 12:06 PM    CO2 20 11/09/2017 12:06 PM    Anion gap 7 07/12/2017 12:00 PM     Glucose 84 11/09/2017 12:06 PM    BUN 7 11/09/2017 12:06 PM    Creatinine 0.87 11/09/2017 12:06 PM    BUN/Creatinine ratio 8 (L) 11/09/2017 12:06 PM    GFR est AA 119 11/09/2017 12:06 PM    GFR est non-AA 103 11/09/2017 12:06 PM    Calcium 9.5 11/09/2017 12:06 PM             Aveleen Nevers Thamas JaegersK Baldemar Dady, MD

## 2017-11-29 NOTE — Progress Notes (Signed)
Chief Complaint   Patient presents with   ??? New Patient   ??? Chest Pain (Angina)     on exertion     Visit Vitals  BP 142/88 (BP 1 Location: Right arm, BP Patient Position: Sitting)   Pulse 90   Resp 16   Ht 5\' 8"  (1.727 m)   Wt 195 lb (88.5 kg)   SpO2 96%   BMI 29.65 kg/m??     Chest pain on exertion center of chest, sharp pains; fluttery heart beat  SOB frequently; COPD  Dizziness slightly on activity; almost to blacking out  Swelling around ankles slightly  Recent hospital visit denied  Refills review    EKg performed

## 2017-11-29 NOTE — Addendum Note (Signed)
Addended by: Bebe LiterJARREAU, Tonna Palazzi K on: 12/03/2017 09:52 AM     Modules accepted: Orders

## 2018-01-03 ENCOUNTER — Ambulatory Visit

## 2018-01-03 ENCOUNTER — Ambulatory Visit: Admit: 2018-01-03 | Discharge: 2018-01-03 | Payer: PRIVATE HEALTH INSURANCE | Primary: Family Medicine

## 2018-01-03 DIAGNOSIS — R079 Chest pain, unspecified: Secondary | ICD-10-CM

## 2018-01-03 LAB — NM STRESS TEST WITH MYOCARDIAL PERFUSION
Baseline Diastolic BP: 80 mmHg
Baseline HR: 97 {beats}/min
Baseline O2 Sat: 98 %
Baseline Systolic BP: 140 mmHg
Left Ventricular Ejection Fraction: 59
Stress Diastolic BP: 88 mmHg
Stress O2 Sat: 96 %
Stress Peak HR: 146 {beats}/min
Stress Percent HR Achieved: 84 %
Stress Rate Pressure Product: 23360 bpm*mmHg
Stress ST Depression: 0 mm
Stress ST Elevation: 0 mm
Stress Systolic BP: 160 mmHg
Stress Target HR: 173 {beats}/min

## 2018-01-03 MED ORDER — TECHNETIUM TC-99M SESTAMIBI (CARDIOLITE) INJECTION WITH DILUTION KIT
Freq: Once | INTRAVENOUS | Status: AC
Start: 2018-01-03 — End: 2018-01-03
  Administered 2018-01-03: 18:00:00 via INTRAVENOUS

## 2018-01-03 MED ORDER — REGADENOSON 0.4 MG/5 ML IV SYRINGE
0.4 mg/5 mL | Freq: Once | INTRAVENOUS | Status: AC
Start: 2018-01-03 — End: 2018-01-03
  Administered 2018-01-03: 18:00:00 via INTRAVENOUS

## 2018-01-03 MED ORDER — TECHNETIUM TC-99M SESTAMIBI (CARDIOLITE) INJECTION WITH DILUTION KIT
Freq: Once | INTRAVENOUS | Status: AC
Start: 2018-01-03 — End: 2018-01-03
  Administered 2018-01-03: 17:00:00 via INTRAVENOUS

## 2018-01-03 NOTE — Progress Notes (Signed)
Called, spoke to pt.  Two pt identifiers confirmed.   Pt informed per Dr. Prabhakar his nuclear stress test and echocardiogram is normal.  Pt verbalized understanding of information discussed w/ no further questions at this time.

## 2018-01-03 NOTE — Progress Notes (Signed)
Called, left vm for pt to return call to office.

## 2018-01-03 NOTE — Progress Notes (Signed)
Called, spoke to pt.  Two pt identifiers confirmed.   Pt informed per Dr. Donnella Bi his nuclear stress test and echocardiogram is normal.  Pt verbalized understanding of information discussed w/ no further questions at this time.

## 2018-01-04 LAB — TRANSTHORACIC ECHOCARDIOGRAM (TTE) COMPLETE (CONTRAST/BUBBLE/3D PRN)
Aortic Root: 3.15 cm
E/E' Lateral: 5.2
E/E' Ratio (Averaged): 5.64
E/E' Septal: 6.08
EF BP: 59.9 % (ref 55–100)
IVSd: 1.11 cm — AB (ref 0.6–1)
LA Area 4C: 14.1 cm2
LA Major Axis: 3.83 cm
LA Volume 2C: 28.02 mL (ref 18–58)
LA Volume 4C: 28.6 mL (ref 18–58)
LA Volume BP: 31.34 mL (ref 18–58)
LA Volume Index 2C: 13.86 ml/m2 (ref 16–28)
LA Volume Index 4C: 14.14 ml/m2 (ref 16–28)
LA Volume Index BP: 15.5 ml/m2 (ref 16–28)
LA/AO Root Ratio: 1.21
LV E' Lateral Velocity: 10.88 cm/s
LV E' Septal Velocity: 9.31 cm/s
LV EDV A2C: 73 mL
LV EDV A4C: 76.1 mL
LV EDV BP: 75.1 ml (ref 67–155)
LV EDV Index A2C: 36.1 mL/m2
LV EDV Index A4C: 37.6 mL/m2
LV EDV Index BP: 37.1 mL/m2
LV ESV A2C: 32.5 mL
LV ESV A4C: 28.4 mL
LV ESV BP: 30.1 mL (ref 22–58)
LV ESV Index A2C: 16.1 mL/m2
LV ESV Index A4C: 14 mL/m2
LV ESV Index BP: 14.9 mL/m2
LV Ejection Fraction A2C: 56 %
LV Ejection Fraction A4C: 63 %
LV Mass 2D Index: 61.2 g/m2 (ref 49–115)
LV Mass 2D: 143.2 g (ref 88–224)
LVIDd: 3.77 cm — AB (ref 4.2–5.9)
LVIDs: 2.67 cm
LVOT Diameter: 2.28 cm
LVPWd: 1.01 cm — AB (ref 0.6–1)
Left Ventricular Ejection Fraction: 63
MV A Velocity: 59.3 cm/s
MV Area by PHT: 4.1 cm2
MV E Velocity: 56.57 cm/s
MV E Wave Deceleration Time: 186.8 ms
MV E/A: 1
MV PHT: 54.2 ms
RA Area 4C: 13.47 cm2
TAPSE: 1.82 cm (ref 1.5–2)

## 2018-01-04 LAB — ECHO ADULT COMPLETE
Aortic Root: 3.15 cm
E/E' Lateral: 5.2
E/E' Ratio (Averaged): 5.64
E/E' Septal: 6.08
EF BP: 59.9 % (ref 55–100)
IVSd: 1.11 cm — AB (ref 0.6–1.0)
LA Area 4C: 14.1 cm2
LA Major Axis: 3.83 cm
LA Volume 2C: 28.02 mL (ref 18–58)
LA Volume 4C: 28.6 mL (ref 18–58)
LA Volume BP: 31.34 mL (ref 18–58)
LA Volume Index 2C: 13.86 ml/m2 (ref 16–28)
LA Volume Index 4C: 14.14 ml/m2 (ref 16–28)
LA Volume Index BP: 15.5 ml/m2 (ref 16–28)
LA/AO Root Ratio: 1.21
LV E' Lateral Velocity: 10.88 cm/s
LV E' Septal Velocity: 9.31 cm/s
LV EDV A2C: 73 mL
LV EDV A4C: 76.1 mL
LV EDV BP: 75.1 ml (ref 67–155)
LV EDV Index A2C: 36.1 mL/m2
LV EDV Index A4C: 37.6 mL/m2
LV EDV Index BP: 37.1 mL/m2
LV ESV A2C: 32.5 mL
LV ESV A4C: 28.4 mL
LV ESV BP: 30.1 mL (ref 22–58)
LV ESV Index A2C: 16.1 mL/m2
LV ESV Index A4C: 14 mL/m2
LV ESV Index BP: 14.9 mL/m2
LV Ejection Fraction A2C: 56 %
LV Ejection Fraction A4C: 63 %
LV Mass 2D Index: 61.2 g/m2 (ref 49–115)
LV Mass 2D: 143.2 g (ref 88–224)
LVIDd: 3.77 cm — AB (ref 4.2–5.9)
LVIDs: 2.67 cm
LVOT Diameter: 2.28 cm
LVPWd: 1.01 cm — AB (ref 0.6–1.0)
MV A Velocity: 59.3 cm/s
MV Area by PHT: 4.1 cm2
MV E Velocity: 56.57 cm/s
MV E Wave Deceleration Time: 186.8 ms
MV E/A: 1
MV PHT: 54.2 ms
RA Area 4C: 13.47 cm2
TAPSE: 1.82 cm (ref 1.5–2.0)

## 2018-01-04 LAB — NUCLEAR CARDIAC STRESS TEST
Baseline Diastolic BP: 80 mmHg
Baseline HR: 97 {beats}/min
Baseline O2 Sat: 98 %
Baseline Systolic BP: 140 mmHg
Stress Diastolic BP: 88 mmHg
Stress O2 Sat: 96 %
Stress Peak HR: 146 {beats}/min
Stress Percent HR Achieved: 84 %
Stress Rate Pressure Product: 23360 bpm*mmHg
Stress ST Depression: 0 mm
Stress ST Elevation: 0 mm
Stress Systolic BP: 160 mmHg
Stress Target HR: 173 {beats}/min

## 2018-01-14 ENCOUNTER — Encounter: Attending: Internal Medicine | Primary: Family Medicine

## 2018-02-05 ENCOUNTER — Ambulatory Visit: Attending: Internal Medicine | Primary: Family Medicine

## 2018-02-05 ENCOUNTER — Ambulatory Visit
Admit: 2018-02-05 | Discharge: 2018-02-05 | Payer: PRIVATE HEALTH INSURANCE | Attending: Internal Medicine | Primary: Family Medicine

## 2018-02-05 DIAGNOSIS — R0600 Dyspnea, unspecified: Secondary | ICD-10-CM

## 2018-02-05 MED ORDER — METOPROLOL SUCCINATE SR 25 MG 24 HR TAB
25 mg | ORAL_TABLET | Freq: Every day | ORAL | 3 refills | Status: AC
Start: 2018-02-05 — End: ?

## 2018-02-05 NOTE — Addendum Note (Signed)
Addended by: Dorena CookeyKELLY, Estrellita Lasky A on: 02/05/2018 10:27 AM     Modules accepted: Orders

## 2018-02-05 NOTE — Progress Notes (Signed)
Geoffrey Murphy is a 47 y.o. male    Chief Complaint   Patient presents with   ??? Follow-up     6 wk f/u    ??? Chest Pain   ??? Breathing Problem   ??? Hypertension   ??? Cholesterol Problem     DLD       Chest pain NO  SOB Pt states getting SOB with exertion.  Dizziness Pt states he gets dizzy when he walks a long ways. He has to take a break walking long distance.  Swelling pt states having some swelling in both of his feet and ankles.  Recent hospital visit NO  Refills NO    Visit Vitals  BP (!) 132/92 (BP 1 Location: Left arm, BP Patient Position: Sitting)   Pulse 82   Resp 20   Ht 5' 8" (1.727 m)   Wt 195 lb 9.6 oz (88.7 kg)   SpO2 94%   BMI 29.74 kg/m??       1. Have you been to the ER, urgent care clinic since your last visit?  Hospitalized since your last visit? NO    2. Have you seen or consulted any other health care providers outside of the [facility] since your last visit?  Include any pap smears or colon screening.  NO

## 2018-02-05 NOTE — Progress Notes (Signed)
Geoffrey FewShashi K Nioka Thorington, MD    [address],[facility],[place], [place] [id number]    Office [phone],Fax [phone]  Pager [phone]    Geoffrey Murphy is a 47 y.o. male is here for f/u visit .    Primary care physician:  Geoffrey LordsMurray, Latisha C, MD    Patient Active Problem List   Diagnosis Code   ??? Hypercholesterolemia E78.00   ??? GERD (gastroesophageal reflux disease) K21.9   ??? Depression F32.9   ??? Chronic pain G89.29   ??? Chronic obstructive pulmonary disease [facility](HCC) J44.9       Dear Dr. Dayton Murphy,    I had the pleasure of seeing Geoffrey Murphy in the office today.        Assessment:  Dyspnea  Hyperlipidemia  HTN  Anxiety depression  History of COPD         Plan:   Lexi nuclear study - 01/03/18 nml  Echocardiogram. 01/03/18 EF 60%  Currently not on blood pressure medications.  Blood pressure mildly elevated.  Start Toptrol XL 25mg  daily. F/u PCP for BP check  Followed by pulmonary associates.  Lipids 11/09/2017 reviewed.  Aggressive cardio vascular risk factor modification.  Follow-up in 1 yr or earlier as needed.    Patient understands the plan. All questions were answered to the patient's satisfaction.    Medication Side Effects and Warnings were discussed with patient: yes  Patient Labs were reviewed and or requested:  yes  Patient Past Records were reviewed and or requested: yes    I appreciate the opportunity to be involved in [facility]. Please see note below for details. Please do not hesitate to contact us with questions or concerns.    Geoffrey FewShashi K Raju Coppolino, MD    Cardiac Testing/ Procedures:    A.Cardiac Cath/PCI:    B.ECHO/TEE: 01/03/18 - EF 60%    Murphy.StressNuclear/Stress ECHO/Stress test: 01/03/18 - LExi nuc study - Nml     D.Vascular:    E. EP:    F. Miscellaneous:    History of present illness:    Continues to have dyspnea /occ chest tightness ( attributes to his COPD)    ( 11/29/17 47 yr old CM with history of COPD, chronic back pain who has  been having intermittent chest tightness.  Usually associated with shortness of breath and coughing probably secondary to his COPD.  No prior history of CAD.  Mild swelling lower extremities.  Has bilateral leg pain secondary to his chronic back pain/disc disease/nerve injury.)      ROS:  (bold if positive, if negative)             Medications before admission:    Current Outpatient Medications   Medication Sig Dispense   ??? TRELEGY ELLIPTA 100-62.5-25 mcg inhaler INHALE 1 PUFF BY MOUTH ONCE DAILY    ??? albuterol (PROVENTIL VENTOLIN) 2.5 mg /3 mL (0.083 %) nebulizer solution Take 3 mL by inhalation two (2) times daily as needed.    ??? omeprazole (PRILOSEC) 20 mg capsule Take 1 Cap by mouth daily.    ??? buPROPion SR (WELLBUTRIN SR) 150 mg SR tablet Take 1 Tab by mouth two (2) times a day.    ??? atorvastatin (LIPITOR) 10 mg tablet Take 1 Tab by mouth daily.    ??? gabapentin (NEURONTIN) 300 mg capsule Take 1 Cap by mouth three (3) times daily.    ??? methocarbamol (ROBAXIN) 500 mg tablet Take 1.5 Tabs by mouth two (2) times daily as needed.    ???  mirtazapine (REMERON) 15 mg tablet Take 1 Tab by mouth daily.    ??? meloxicam (MOBIC) 15 mg tablet Take 1 Tab by mouth daily.    ??? aspirin delayed-release 81 mg tablet Take 81 mg by mouth daily.    ??? albuterol (PROAIR HFA) 90 mcg/actuation inhaler Take 2 Puffs by inhalation every four (4) hours as needed for Wheezing. 1 Inhaler   ??? ergocalciferol (VITAMIN D2) 50,000 unit capsule Take 50,000 Units by mouth every seven (7) days.    ??? tiotropium-olodaterol (STIOLTO RESPIMAT) 2.5-2.5 mcg/actuation inhaler Take 1 Puff by inhalation daily.      No current facility-administered medications for this visit.        Family History of CAD:    Yes    Social History:  Current  Smoker  No    Physical Exam:  Visit Vitals  BP (!) 132/92 (BP 1 Location: Left arm, BP Patient Position: Sitting)   Pulse 82   Resp 20   Ht 5\' 8"  (1.727 m)   Wt 195 lb 9.6 oz (88.7 kg)   SpO2 94%   BMI 29.74 kg/m??           Gen: Well-developed, well-nourished, in no acute distress  Neck: Supple,No JVD, No Carotid Bruit,   Resp: No accessory muscle use, Clear breath sounds, No rales or rhonchi  Card: Regular Rate,Rythm,Normal S1, S2, No murmurs, rubs or gallop. No thrills.   Abd:  Soft, non-tender, non-distended,BS+,   MSK: No cyanosis  Skin: No rashes    Neuro: moving all four extremities , follows commands appropriately  Psych:  Good insight, oriented to person, place , alert, Nml Affect  LE: No edema    EKG:      LABS:        Lab Results   Component Value Date/Time    WBC 10.2 11/09/2017 12:06 PM    HGB 17.4 11/09/2017 12:06 PM    HCT 50.4 11/09/2017 12:06 PM    PLATELET 231 11/09/2017 12:06 PM     Lab Results   Component Value Date/Time    Sodium 140 11/09/2017 12:06 PM    Potassium 4.6 11/09/2017 12:06 PM    Chloride 102 11/09/2017 12:06 PM    CO2 20 11/09/2017 12:06 PM    Anion gap 7 07/12/2017 12:00 PM    Glucose 84 11/09/2017 12:06 PM    BUN 7 11/09/2017 12:06 PM    Creatinine 0.87 11/09/2017 12:06 PM    BUN/Creatinine ratio 8 (L) 11/09/2017 12:06 PM    GFR est AA 119 11/09/2017 12:06 PM    GFR est non-AA 103 11/09/2017 12:06 PM    Calcium 9.5 11/09/2017 12:06 PM       No results found for: APTT  No results found for: INR, PTMR, PTP, PT1, PT2  No components found for: LAST LIPIDS        Geoffrey Few, MD

## 2018-02-05 NOTE — Addendum Note (Signed)
Addendum Note by Sharin GraveKelly, Metese A, LPN at 16/05/9606/02/19 0920                Author: Sharin GraveKelly, Metese A, LPN  Service: --  Author Type: Licensed Nurse       Filed: 02/05/18 1027  Encounter Date: 02/05/2018  Status: Signed          Editor: Sharin GraveKelly, Metese A, LPN (Licensed Nurse)          Addended by: Dorena CookeyKELLY, METESE A on: 02/05/2018 10:27 AM    Modules accepted: Orders

## 2018-02-05 NOTE — Progress Notes (Signed)
Progress Notes by Lilli Few, MD at 02/05/18 1610                Author: Lilli Few, MD  Service: --  Author Type: Physician       Filed: 02/05/18 1023  Encounter Date: 02/05/2018  Status: Signed          Editor: Lilli Few, MD (Physician)                       Lilli Few, MD      [address] [place], [place] [address]      Office [phone]   Pager [phone]      Geoffrey Murphy is a 47 y.o.  male is here for f/u visit .      Primary care physician:   Dara Lords, MD        Patient Active Problem List        Diagnosis  Code         ?  Hypercholesterolemia  E78.00     ?  GERD (gastroesophageal reflux disease)  K21.9     ?  Depression  F32.9     ?  Chronic pain  G89.29         ?  Chronic obstructive pulmonary disease [facility])  J44.9           Dear Dr. Dayton Scrape,      I had the pleasure of seeing Geoffrey Murphy in the office today.            Assessment:   Dyspnea   Hyperlipidemia   HTN   Anxiety depression   History of COPD             Plan:    Lexi nuclear study - 01/03/18 nml   Echocardiogram. 01/03/18 EF 60%   Currently not on blood pressure medications.  Blood pressure mildly elevated.  Start Toptrol XL 25mg  daily. F/u PCP for BP check   Followed by pulmonary associates.   Lipids 11/09/2017 reviewed.   Aggressive cardio vascular risk factor modification.   Follow-up in 1 yr or earlier as needed.      Patient understands the plan. All questions were answered to the patient's satisfaction.      Medication Side Effects and Warnings were discussed with patient: yes   Patient Labs were reviewed and or requested:  yes   Patient Past Records were reviewed and or requested: yes      I appreciate the opportunity to be involved in [facility]. Please see note below for details. Please do not hesitate to contact us with questions or concerns.      Lilli Few, MD      Cardiac Testing/ Procedures:      A.Cardiac Cath/PCI:       B.ECHO/TEE: 01/03/18 - EF 60%      C.StressNuclear/Stress ECHO/Stress test: 01/03/18 - LExi nuc study - Nml       D.Vascular:      E. EP:      F. Miscellaneous:      History of present illness:      Continues to have dyspnea /occ chest tightness ( attributes to his COPD)      ( 11/29/17 47 yr old CM with history of COPD, chronic back pain who has been having intermittent chest tightness.  Usually associated with shortness of breath and coughing probably secondary to his  COPD.  No prior history of CAD.  Mild swelling lower extremities.   Has bilateral leg pain secondary to his chronic back pain/disc disease/nerve injury.)         ROS:   (bold if positive,  if negative)                   Medications before admission:        Current Outpatient Medications         Medication  Sig  Dispense          ?  TRELEGY ELLIPTA 100-62.5-25 mcg inhaler  INHALE 1 PUFF BY MOUTH ONCE DAILY       ?  albuterol (PROVENTIL VENTOLIN) 2.5 mg /3 mL (0.083 %) nebulizer solution  Take 3 mL by inhalation two (2) times daily as needed.       ?  omeprazole (PRILOSEC) 20 mg capsule  Take 1 Cap by mouth daily.       ?  buPROPion SR (WELLBUTRIN SR) 150 mg SR tablet  Take 1 Tab by mouth two (2) times a day.            ?  atorvastatin (LIPITOR) 10 mg tablet  Take 1 Tab by mouth daily.            ?  gabapentin (NEURONTIN) 300 mg capsule  Take 1 Cap by mouth three (3) times daily.       ?  methocarbamol (ROBAXIN) 500 mg tablet  Take 1.5 Tabs by mouth two (2) times daily as needed.       ?  mirtazapine (REMERON) 15 mg tablet  Take 1 Tab by mouth daily.       ?  meloxicam (MOBIC) 15 mg tablet  Take 1 Tab by mouth daily.       ?  aspirin delayed-release 81 mg tablet  Take 81 mg by mouth daily.       ?  albuterol (PROAIR HFA) 90 mcg/actuation inhaler  Take 2 Puffs by inhalation every four (4) hours as needed for Wheezing.  1 Inhaler     ?  ergocalciferol (VITAMIN D2) 50,000 unit capsule  Take 50,000 Units by mouth every seven (7) days.            ?   tiotropium-olodaterol (STIOLTO RESPIMAT) 2.5-2.5 mcg/actuation inhaler  Take 1 Puff by inhalation daily.            No current facility-administered medications for this visit.            Family History of CAD:    Yes      Social History:  Current  Smoker  No      Physical Exam:   Visit Vitals      BP  (!) 132/92 (BP 1 Location: Left arm, BP Patient Position: Sitting)     Pulse  82     Resp  20     Ht  5\' 8"  (1.727 m)     Wt  195 lb 9.6 oz (88.7 kg)     SpO2  94%        BMI  29.74 kg/m??               Gen: Well-developed, well-nourished, in no acute distress   Neck: Supple,No JVD, No Carotid Bruit,    Resp: No accessory muscle use, Clear breath sounds, No rales or rhonchi   Card: Regular Rate,Rythm,Normal S1, S2, No murmurs, rubs or gallop. No thrills.  Abd:  Soft, non-tender, non-distended,BS+,    MSK: No cyanosis   Skin: No rashes     Neuro: moving all four extremities , follows commands appropriately   Psych:  Good insight, oriented to person, place , alert, Nml Affect   LE: No edema      EKG:         LABS:              Lab Results         Component  Value  Date/Time            WBC  10.2  11/09/2017 12:06 PM       HGB  17.4  11/09/2017 12:06 PM       HCT  50.4  11/09/2017 12:06 PM            PLATELET  231  11/09/2017 12:06 PM          Lab Results         Component  Value  Date/Time            Sodium  140  11/09/2017 12:06 PM       Potassium  4.6  11/09/2017 12:06 PM       Chloride  102  11/09/2017 12:06 PM       CO2  20  11/09/2017 12:06 PM       Anion gap  7  07/12/2017 12:00 PM       Glucose  84  11/09/2017 12:06 PM       BUN  7  11/09/2017 12:06 PM       Creatinine  0.87  11/09/2017 12:06 PM       BUN/Creatinine ratio  8 (L)  11/09/2017 12:06 PM       GFR est AA  119  11/09/2017 12:06 PM       GFR est non-AA  103  11/09/2017 12:06 PM            Calcium  9.5  11/09/2017 12:06 PM           No results found for: APTT   No results found for: INR, PTMR, PTP, PT1, PT2   No components found for: LAST LIPIDS             Lilli FewShashi K Edras Wilford, MD

## 2018-02-05 NOTE — Progress Notes (Signed)
 Geoffrey Murphy is a 47 y.o. male    Chief Complaint   Patient presents with   . Follow-up     6 wk f/u    . Chest Pain   . Breathing Problem   . Hypertension   . Cholesterol Problem     DLD       Chest pain NO  SOB Pt states getting SOB with exertion.  Dizziness Pt states he gets dizzy when he walks a long ways. He has to take a break walking long distance.  Swelling pt states having some swelling in both of his feet and ankles.  Recent hospital visit NO  Refills NO    Visit Vitals  BP (!) 132/92 (BP 1 Location: Left arm, BP Patient Position: Sitting)   Pulse 82   Resp 20   Ht 5' 8 (1.727 m)   Wt 195 lb 9.6 oz (88.7 kg)   SpO2 94%   BMI 29.74 kg/m       1. Have you been to the ER, urgent care clinic since your last visit?  Hospitalized since your last visit? NO    2. Have you seen or consulted any other health care providers outside of the [facility] since your last visit?  Include any pap smears or colon screening.  NO

## 2018-02-08 ENCOUNTER — Encounter: Attending: Internal Medicine | Primary: Family Medicine

## 2018-03-08 ENCOUNTER — Ambulatory Visit: Attending: Family Medicine | Primary: Family Medicine

## 2018-03-08 ENCOUNTER — Ambulatory Visit
Admit: 2018-03-08 | Discharge: 2018-03-08 | Payer: PRIVATE HEALTH INSURANCE | Attending: Family Medicine | Primary: Family Medicine

## 2018-03-08 DIAGNOSIS — M5136 Other intervertebral disc degeneration, lumbar region: Secondary | ICD-10-CM

## 2018-03-08 MED ORDER — ATORVASTATIN 10 MG TAB
10 mg | ORAL_TABLET | Freq: Every day | ORAL | 1 refills | Status: DC
Start: 2018-03-08 — End: 2018-07-09

## 2018-03-08 MED ORDER — METHOCARBAMOL 500 MG TAB
500 mg | ORAL_TABLET | Freq: Two times a day (BID) | ORAL | 1 refills | Status: DC | PRN
Start: 2018-03-08 — End: 2018-07-09

## 2018-03-08 MED ORDER — MELOXICAM 15 MG TAB
15 mg | ORAL_TABLET | Freq: Every day | ORAL | 1 refills | Status: AC
Start: 2018-03-08 — End: ?

## 2018-03-08 MED ORDER — GABAPENTIN 300 MG CAP
300 mg | ORAL_CAPSULE | Freq: Three times a day (TID) | ORAL | 1 refills | Status: DC
Start: 2018-03-08 — End: 2018-10-16

## 2018-03-08 MED ORDER — OMEPRAZOLE 20 MG CAP, DELAYED RELEASE
20 mg | ORAL_CAPSULE | Freq: Every day | ORAL | 0 refills | Status: DC
Start: 2018-03-08 — End: 2018-07-09

## 2018-03-08 MED ORDER — PNEUMOCOCCAL 23-VALPS VACCINE 25 MCG/0.5 ML INJECTION
25 mcg/0.5 mL | Freq: Once | INTRAMUSCULAR | 0 refills | Status: AC
Start: 2018-03-08 — End: 2018-03-08

## 2018-03-08 NOTE — Patient Instructions (Signed)
A Healthy Lifestyle: Care Instructions  Your Care Instructions    A healthy lifestyle can help you feel good, stay at a healthy weight, and have plenty of energy for both work and play. A healthy lifestyle is something you can share with your whole family.  A healthy lifestyle also can lower your risk for serious health problems, such as high blood pressure, heart disease, and diabetes.  You can follow a few steps listed below to improve your health and the health of your family.  Follow-up care is a key part of your treatment and safety. Be sure to make and go to all appointments, and call your doctor if you are having problems. It's also a good idea to know your test results and keep a list of the medicines you take.  How can you care for yourself at home?  ?? Do not eat too much sugar, fat, or fast foods. You can still have dessert and treats now and then. The goal is moderation.  ?? Start small to improve your eating habits. Pay attention to portion sizes, drink less juice and soda pop, and eat more fruits and vegetables.  ? Eat a healthy amount of food. A 3-ounce serving of meat, for example, is about the size of a deck of cards. Fill the rest of your plate with vegetables and whole grains.  ? Limit the amount of soda and sports drinks you have every day. Drink more water when you are thirsty.  ? Eat at least 5 servings of fruits and vegetables every day. It may seem like a lot, but it is not hard to reach this goal. A serving or helping is 1 piece of fruit, 1 cup of vegetables, or 2 cups of leafy, raw vegetables. Have an apple or some carrot sticks as an afternoon snack instead of a candy bar. Try to have fruits and/or vegetables at every meal.  ?? Make exercise part of your daily routine. You may want to start with simple activities, such as walking, bicycling, or slow swimming. Try to be active 30 to 60 minutes every day. You do not need to do all 30 to 60  minutes all at once. For example, you can exercise 3 times a day for 10 or 20 minutes. Moderate exercise is safe for most people, but it is always a good idea to talk to your doctor before starting an exercise program.  ?? Keep moving. Mow the lawn, work in the garden, or clean your house. Take the stairs instead of the elevator at work.  ?? If you smoke, quit. People who smoke have an increased risk for heart attack, stroke, cancer, and other lung illnesses. Quitting is hard, but there are ways to boost your chance of quitting tobacco for good.  ? Use nicotine gum, patches, or lozenges.  ? Ask your doctor about stop-smoking programs and medicines.  ? Keep trying.  In addition to reducing your risk of diseases in the future, you will notice some benefits soon after you stop using tobacco. If you have shortness of breath or asthma symptoms, they will likely get better within a few weeks after you quit.  ?? Limit how much alcohol you drink. Moderate amounts of alcohol (up to 2 drinks a day for men, 1 drink a day for women) are okay. But drinking too much can lead to liver problems, high blood pressure, and other health problems.  Family health  If you have a family, there are many things you   can do together to improve your health.  ?? Eat meals together as a family as often as possible.  ?? Eat healthy foods. This includes fruits, vegetables, lean meats and dairy, and whole grains.  ?? Include your family in your fitness plan. Most people think of activities such as jogging or tennis as the way to fitness, but there are many ways you and your family can be more active. Anything that makes you breathe hard and gets your heart pumping is exercise. Here are some tips:  ? Walk to do errands or to take your child to school or the bus.  ? Go for a family bike ride after dinner instead of watching TV.  Where can you learn more?  Go to http://www.healthwise.net/[employer].   Enter U807 in the search box to learn more about "A Healthy Lifestyle: Care Instructions."  Current as of: April 17, 2017  Content Version: 12.1  ?? 2006-2019 [employer]. Care instructions adapted under license by [employer] (which disclaims liability or warranty for this information). If you have questions about a medical condition or this instruction, always ask your healthcare professional. [employer]wise[employer] disclaims any warranty or liability for your use of this information.

## 2018-03-08 NOTE — Progress Notes (Signed)
Identified pt with two pt identifiers(name and DOB).    Chief Complaint   Patient presents with   ??? Medication Refill        Health Maintenance Due   Topic   ??? Pneumococcal 0-64 years (1 of 1 - PPSV23)   ??? DTaP/Tdap/Td series (1 - Tdap)   ??? Influenza Age 539 to Adult        Wt Readings from Last 3 Encounters:   03/08/18 195 lb (88.5 kg)   02/05/18 195 lb 9.6 oz (88.7 kg)   01/03/18 195 lb (88.5 kg)     Temp Readings from Last 3 Encounters:   03/08/18 98.3 ??F (36.8 ??C) (Oral)   11/09/17 98.2 ??F (36.8 ??C) (Oral)   01/17/16 98.6 ??F (37 ??C)     BP Readings from Last 3 Encounters:   03/08/18 122/80   02/05/18 (!) 132/92   01/03/18 138/84     Pulse Readings from Last 3 Encounters:   03/08/18 94   02/05/18 82   11/29/17 90         Learning Assessment:  :     Learning Assessment 11/09/2017   PRIMARY LEARNER Patient   PRIMARY LANGUAGE ENGLISH   LEARNER PREFERENCE PRIMARY DEMONSTRATION   ANSWERED BY self   RELATIONSHIP SELF       Depression Screening:  :     3 most recent PHQ Screens 11/09/2017   Little interest or pleasure in doing things Not at all   Feeling down, depressed, irritable, or hopeless Not at all   Total Score PHQ 2 0       Fall Risk Assessment:  :     No flowsheet data found.    Abuse Screening:  :     Abuse Screening Questionnaire 11/09/2017   Do you ever feel afraid of your partner? N   Are you in a relationship with someone who physically or mentally threatens you? N   Is it safe for you to go home? Y       Coordination of Care Questionnaire:  :     1) Have you been to an emergency room, urgent care clinic since your last visit? no   Hospitalized since your last visit? no             2) Have you seen or consulted any other health care providers outside of [facility] since your last visit? no  (Include any pap smears or colon screenings in this section.)    3) Do you have an Advance Directive on file? no  Are you interested in receiving information about Advance Directives? no     Reviewed record in preparation for visit and have obtained necessary documentation.  Medication reconciliation up to date and corrected with patient at this time.

## 2018-03-08 NOTE — Progress Notes (Signed)
Subjective:      Geoffrey Murphy is a 47 y.o. male here today for medication refills.     Lumbar DDD: would like referral to PT for low back pain. Has been doing okay on gabapentin. No reported bowel or bladder incontinence, saddle anesthesia, LE paresthesias.     GERD: on omeprazole 20 mg which he takes PRN. Typically finds that he needs after eating certain foods. Denies hematemesis, melena, hematochezia, unexplained weight loss, dysphagia.     Started on metoprolol for hypertension by Cardiology. Tolerating medication well.     Current Outpatient Medications   Medication Sig Dispense Refill   ??? QUEtiapine (SEROQUEL) 100 mg tablet Take 1 Tab by mouth daily.  1   ??? TRELEGY ELLIPTA 100-62.5-25 mcg inhaler INHALE 1 PUFF BY MOUTH ONCE DAILY  12   ??? metoprolol succinate (TOPROL-XL) 25 mg XL tablet Take 1 Tab by mouth daily. 90 Tab 3   ??? albuterol (PROVENTIL VENTOLIN) 2.5 mg /3 mL (0.083 %) nebulizer solution Take 3 mL by inhalation two (2) times daily as needed.     ??? omeprazole (PRILOSEC) 20 mg capsule Take 1 Cap by mouth daily.     ??? buPROPion SR (WELLBUTRIN SR) 150 mg SR tablet Take 1 Tab by mouth two (2) times a day.     ??? atorvastatin (LIPITOR) 10 mg tablet Take 1 Tab by mouth daily.     ??? gabapentin (NEURONTIN) 300 mg capsule Take 1 Cap by mouth three (3) times daily.     ??? methocarbamol (ROBAXIN) 500 mg tablet Take 1.5 Tabs by mouth two (2) times daily as needed.     ??? meloxicam (MOBIC) 15 mg tablet Take 1 Tab by mouth daily.     ??? aspirin delayed-release 81 mg tablet Take 81 mg by mouth daily.     ??? albuterol (PROAIR HFA) 90 mcg/actuation inhaler Take 2 Puffs by inhalation every four (4) hours as needed for Wheezing. 1 Inhaler 0       No Known Allergies    Past Medical History:   Diagnosis Date   ??? Chronic obstructive pulmonary disease (HCC)    ??? Chronic pain     back pain   ??? Depression    ??? GERD (gastroesophageal reflux disease)    ??? Hypercholesterolemia        Social History     Tobacco Use    ??? Smoking status: Former Smoker     Types: Cigarettes     Last attempt to quit: 09/12/2016     Years since quitting: 1.4   ??? Smokeless tobacco: Never Used   Substance Use Topics   ??? Alcohol use: Yes     Frequency: Monthly or less     Drinks per session: 1 or 2     Binge frequency: Never        Review of Systems  Pertinent items are noted in HPI.     Objective:     Visit Vitals  BP 122/80 (BP 1 Location: Right arm, BP Patient Position: Sitting) Comment: Manual   Pulse 94   Temp 98.3 ??F (36.8 ??C) (Oral) Comment: .   Resp 20   Ht 5\' 8"  (1.727 m)   Wt 195 lb (88.5 kg)   SpO2 94%   BMI 29.65 kg/m??      General appearance - alert, well appearing, and in no distress  Eyes - pupils equal and reactive, extraocular eye movements intact, sclera anicteric  Chest - wheezing noted, no  rales or rhonchi, symmetric air entry, no tachypnea, retractions or cyanosis  Heart - normal rate, regular rhythm, normal S1, S2, no murmurs, rubs, clicks or gallops  Extremities - peripheral pulses normal, no pedal edema, no clubbing or cyanosis  Back exam: antalgic gait, limited range of motion, tenderness noted lumbar spine.    Assessment/Plan:   Geoffrey Murphy is a 47 y.o. male seen for:     1. DDD (degenerative disc disease), lumbar: continue with current therapies. Refer to PT.   - gabapentin (NEURONTIN) 300 mg capsule; Take 1 Cap by mouth three (3) times daily. Max Daily Amount: 900 mg.  Dispense: 270 Cap; Refill: 1  - methocarbamol (ROBAXIN) 500 mg tablet; Take 1.5 Tabs by mouth two (2) times daily as needed for Pain (Muscle spasm).  Dispense: 90 Tab; Refill: 1  - meloxicam (MOBIC) 15 mg tablet; Take 1 Tab by mouth daily.  Dispense: 90 Tab; Refill: 1  - REFERRAL TO PHYSICAL THERAPY    2. Chronic midline low back pain, with sciatica presence unspecified  - gabapentin (NEURONTIN) 300 mg capsule; Take 1 Cap by mouth three (3) times daily. Max Daily Amount: 900 mg.  Dispense: 270 Cap; Refill: 1   - methocarbamol (ROBAXIN) 500 mg tablet; Take 1.5 Tabs by mouth two (2) times daily as needed for Pain (Muscle spasm).  Dispense: 90 Tab; Refill: 1  - meloxicam (MOBIC) 15 mg tablet; Take 1 Tab by mouth daily.  Dispense: 90 Tab; Refill: 1  - REFERRAL TO PHYSICAL THERAPY    3. Hypercholesterolemia: continue with stain therapy.   - atorvastatin (LIPITOR) 10 mg tablet; Take 1 Tab by mouth daily.  Dispense: 90 Tab; Refill: 1    4. Gastroesophageal reflux disease, esophagitis presence not specified  - omeprazole (PRILOSEC) 20 mg capsule; Take 1 Cap by mouth daily.  Dispense: 90 Cap; Refill: 0    5. Vitamin D deficiency  - VITAMIN D, 25 HYDROXY    6. Chronic obstructive pulmonary disease, unspecified COPD type (HCC)  - pneumococcal 23-valent (PNEUMOVAX 23) 25 mcg/0.5 mL injection; 0.5 mL by IntraMUSCular route once for 1 dose.  Dispense: 0.5 mL; Refill: 0    I have discussed the diagnosis with the patient and the intended plan as seen in the above orders. The patient has received an after-visit summary and questions were answered concerning future plans. I have discussed medication side effects and warnings with the patient as well. Patient verbalizes understanding of plan of care and denies further questions or concerns at this time. Informed patient to return to the office if symptoms worsen or if new symptoms arise.       Follow-up and Dispositions    ?? Return in about 4 months (around 07/08/2018) for follow up with fasting labs or sooner as needed.

## 2018-03-08 NOTE — Progress Notes (Signed)
 Identified pt with two pt identifiers(name and DOB).    Chief Complaint   Patient presents with   . Medication Refill        Health Maintenance Due   Topic   . Pneumococcal 0-64 years (1 of 1 - PPSV23)   . DTaP/Tdap/Td series (1 - Tdap)   . Influenza Age 47 to Adult        Wt Readings from Last 3 Encounters:   03/08/18 195 lb (88.5 kg)   02/05/18 195 lb 9.6 oz (88.7 kg)   01/03/18 195 lb (88.5 kg)     Temp Readings from Last 3 Encounters:   03/08/18 98.3 F (36.8 C) (Oral)   11/09/17 98.2 F (36.8 C) (Oral)   01/17/16 98.6 F (37 C)     BP Readings from Last 3 Encounters:   03/08/18 122/80   02/05/18 (!) 132/92   01/03/18 138/84     Pulse Readings from Last 3 Encounters:   03/08/18 94   02/05/18 82   11/29/17 90         Learning Assessment:  :     Learning Assessment 11/09/2017   PRIMARY LEARNER Patient   PRIMARY LANGUAGE ENGLISH   LEARNER PREFERENCE PRIMARY DEMONSTRATION   ANSWERED BY self   RELATIONSHIP SELF       Depression Screening:  :     3 most recent PHQ Screens 11/09/2017   Little interest or pleasure in doing things Not at all   Feeling down, depressed, irritable, or hopeless Not at all   Total Score PHQ 2 0       Fall Risk Assessment:  :     No flowsheet data found.    Abuse Screening:  :     Abuse Screening Questionnaire 11/09/2017   Do you ever feel afraid of your partner? N   Are you in a relationship with someone who physically or mentally threatens you? N   Is it safe for you to go home? Y       Coordination of Care Questionnaire:  :     1) Have you been to an emergency room, urgent care clinic since your last visit? no   Hospitalized since your last visit? no             2) Have you seen or consulted any other health care providers outside of [facility] since your last visit? no  (Include any pap smears or colon screenings in this section.)    3) Do you have an Advance Directive on file? no  Are you interested in receiving information about Advance Directives? no    Reviewed record in  preparation for visit and have obtained necessary documentation.  Medication reconciliation up to date and corrected with patient at this time.

## 2018-03-08 NOTE — Progress Notes (Signed)
Subjective:      Zollie BeckersCornelius S Jankowski is a 47 y.o. male here today for medication refills.     Lumbar DDD: would like referral to PT for low back pain. Has been doing okay on gabapentin. No reported bowel or bladder incontinence, saddle anesthesia, LE paresthesias.     GERD: on omeprazole 20 mg which he takes PRN. Typically finds that he needs after eating certain foods. Denies hematemesis, melena, hematochezia, unexplained weight loss, dysphagia.     Started on metoprolol for hypertension by Cardiology. Tolerating medication well.     Current Outpatient Medications   Medication Sig Dispense Refill   ??? QUEtiapine (SEROQUEL) 100 mg tablet Take 1 Tab by mouth daily.  1   ??? TRELEGY ELLIPTA 100-62.5-25 mcg inhaler INHALE 1 PUFF BY MOUTH ONCE DAILY  12   ??? metoprolol succinate (TOPROL-XL) 25 mg XL tablet Take 1 Tab by mouth daily. 90 Tab 3   ??? albuterol (PROVENTIL VENTOLIN) 2.5 mg /3 mL (0.083 %) nebulizer solution Take 3 mL by inhalation two (2) times daily as needed.     ??? omeprazole (PRILOSEC) 20 mg capsule Take 1 Cap by mouth daily.     ??? buPROPion SR (WELLBUTRIN SR) 150 mg SR tablet Take 1 Tab by mouth two (2) times a day.     ??? atorvastatin (LIPITOR) 10 mg tablet Take 1 Tab by mouth daily.     ??? gabapentin (NEURONTIN) 300 mg capsule Take 1 Cap by mouth three (3) times daily.     ??? methocarbamol (ROBAXIN) 500 mg tablet Take 1.5 Tabs by mouth two (2) times daily as needed.     ??? meloxicam (MOBIC) 15 mg tablet Take 1 Tab by mouth daily.     ??? aspirin delayed-release 81 mg tablet Take 81 mg by mouth daily.     ??? albuterol (PROAIR HFA) 90 mcg/actuation inhaler Take 2 Puffs by inhalation every four (4) hours as needed for Wheezing. 1 Inhaler 0       No Known Allergies    Past Medical History:   Diagnosis Date   ??? Chronic obstructive pulmonary disease (HCC)    ??? Chronic pain     back pain   ??? Depression    ??? GERD (gastroesophageal reflux disease)    ??? Hypercholesterolemia        Social History     Tobacco Use   ??? Smoking  status: Former Smoker     Types: Cigarettes     Last attempt to quit: 09/12/2016     Years since quitting: 1.4   ??? Smokeless tobacco: Never Used   Substance Use Topics   ??? Alcohol use: Yes     Frequency: Monthly or less     Drinks per session: 1 or 2     Binge frequency: Never        Review of Systems  Pertinent items are noted in HPI.     Objective:     Visit Vitals  BP 122/80 (BP 1 Location: Right arm, BP Patient Position: Sitting) Comment: Manual   Pulse 94   Temp 98.3 ??F (36.8 ??C) (Oral) Comment: .   Resp 20   Ht 5\' 8"  (1.727 m)   Wt 195 lb (88.5 kg)   SpO2 94%   BMI 29.65 kg/m??      General appearance - alert, well appearing, and in no distress  Eyes - pupils equal and reactive, extraocular eye movements intact, sclera anicteric  Chest - wheezing noted, no  rales or rhonchi, symmetric air entry, no tachypnea, retractions or cyanosis  Heart - normal rate, regular rhythm, normal S1, S2, no murmurs, rubs, clicks or gallops  Extremities - peripheral pulses normal, no pedal edema, no clubbing or cyanosis  Back exam: antalgic gait, limited range of motion, tenderness noted lumbar spine.    Assessment/Plan:   LAVARIUS DOUGHTEN is a 47 y.o. male seen for:     1. DDD (degenerative disc disease), lumbar: continue with current therapies. Refer to PT.   - gabapentin (NEURONTIN) 300 mg capsule; Take 1 Cap by mouth three (3) times daily. Max Daily Amount: 900 mg.  Dispense: 270 Cap; Refill: 1  - methocarbamol (ROBAXIN) 500 mg tablet; Take 1.5 Tabs by mouth two (2) times daily as needed for Pain (Muscle spasm).  Dispense: 90 Tab; Refill: 1  - meloxicam (MOBIC) 15 mg tablet; Take 1 Tab by mouth daily.  Dispense: 90 Tab; Refill: 1  - REFERRAL TO PHYSICAL THERAPY    2. Chronic midline low back pain, with sciatica presence unspecified  - gabapentin (NEURONTIN) 300 mg capsule; Take 1 Cap by mouth three (3) times daily. Max Daily Amount: 900 mg.  Dispense: 270 Cap; Refill: 1  - methocarbamol (ROBAXIN) 500 mg tablet; Take 1.5 Tabs by  mouth two (2) times daily as needed for Pain (Muscle spasm).  Dispense: 90 Tab; Refill: 1  - meloxicam (MOBIC) 15 mg tablet; Take 1 Tab by mouth daily.  Dispense: 90 Tab; Refill: 1  - REFERRAL TO PHYSICAL THERAPY    3. Hypercholesterolemia: continue with stain therapy.   - atorvastatin (LIPITOR) 10 mg tablet; Take 1 Tab by mouth daily.  Dispense: 90 Tab; Refill: 1    4. Gastroesophageal reflux disease, esophagitis presence not specified  - omeprazole (PRILOSEC) 20 mg capsule; Take 1 Cap by mouth daily.  Dispense: 90 Cap; Refill: 0    5. Vitamin D deficiency  - VITAMIN D, 25 HYDROXY    6. Chronic obstructive pulmonary disease, unspecified COPD type (HCC)  - pneumococcal 23-valent (PNEUMOVAX 23) 25 mcg/0.5 mL injection; 0.5 mL by IntraMUSCular route once for 1 dose.  Dispense: 0.5 mL; Refill: 0    I have discussed the diagnosis with the patient and the intended plan as seen in the above orders. The patient has received an after-visit summary and questions were answered concerning future plans. I have discussed medication side effects and warnings with the patient as well. Patient verbalizes understanding of plan of care and denies further questions or concerns at this time. Informed patient to return to the office if symptoms worsen or if new symptoms arise.       Follow-up and Dispositions    ?? Return in about 4 months (around 07/08/2018) for follow up with fasting labs or sooner as needed.

## 2018-04-10 ENCOUNTER — Encounter

## 2018-04-10 NOTE — Telephone Encounter (Signed)
2nd request

## 2018-04-10 NOTE — Telephone Encounter (Signed)
Pt called requesting albuterol.

## 2018-04-10 NOTE — Telephone Encounter (Signed)
Please disregard all other refill requests. Pt was thinking he needed but they are already at the pharmacy. Pt just needs albuterol.

## 2018-04-12 MED ORDER — ALBUTEROL SULFATE 0.083 % (0.83 MG/ML) SOLN FOR INHALATION
2.5 mg /3 mL (0.083 %) | INHALATION_SOLUTION | Freq: Two times a day (BID) | RESPIRATORY_TRACT | 3 refills | Status: AC | PRN
Start: 2018-04-12 — End: ?

## 2018-04-16 ENCOUNTER — Encounter

## 2018-05-01 ENCOUNTER — Inpatient Hospital Stay: Admit: 2018-05-01 | Payer: PRIVATE HEALTH INSURANCE | Attending: Family | Primary: Family Medicine

## 2018-05-01 DIAGNOSIS — J441 Chronic obstructive pulmonary disease with (acute) exacerbation: Secondary | ICD-10-CM

## 2018-05-01 DIAGNOSIS — J439 Emphysema, unspecified: Secondary | ICD-10-CM

## 2018-05-01 MED ORDER — IOPAMIDOL 76 % IV SOLN
370 mg iodine /mL (76 %) | Freq: Once | INTRAVENOUS | Status: AC
Start: 2018-05-01 — End: 2018-05-01
  Administered 2018-05-01: 20:00:00 via INTRAVENOUS

## 2018-05-01 MED FILL — ISOVUE-370  76 % INTRAVENOUS SOLUTION: 370 mg iodine /mL (76 %) | INTRAVENOUS | Qty: 100

## 2018-06-05 ENCOUNTER — Other Ambulatory Visit
Admit: 2018-06-05 | Discharge: 2018-06-05 | Payer: PRIVATE HEALTH INSURANCE | Attending: Internal Medicine | Primary: Family Medicine

## 2018-06-05 NOTE — Progress Notes (Signed)
Patient presents for labs only.

## 2018-06-06 LAB — VITAMIN D 25 HYDROXY: Vit D, 25-Hydroxy: 11.1 ng/mL — ABNORMAL LOW (ref 30.0–100.0)

## 2018-06-06 LAB — VITAMIN D, 25 HYDROXY: VITAMIN D, 25-HYDROXY: 11.1 ng/mL — ABNORMAL LOW (ref 30.0–100.0)

## 2018-07-09 ENCOUNTER — Ambulatory Visit: Attending: Family Medicine | Primary: Family Medicine

## 2018-07-09 ENCOUNTER — Ambulatory Visit
Admit: 2018-07-09 | Discharge: 2018-07-09 | Payer: PRIVATE HEALTH INSURANCE | Attending: Family Medicine | Primary: Family Medicine

## 2018-07-09 ENCOUNTER — Inpatient Hospital Stay: Admit: 2018-07-09 | Payer: PRIVATE HEALTH INSURANCE | Primary: Family Medicine

## 2018-07-09 DIAGNOSIS — I10 Essential (primary) hypertension: Secondary | ICD-10-CM

## 2018-07-09 MED ORDER — OMEPRAZOLE 20 MG CAP, DELAYED RELEASE
20 mg | ORAL_CAPSULE | Freq: Every day | ORAL | 1 refills | Status: AC
Start: 2018-07-09 — End: ?

## 2018-07-09 MED ORDER — ATORVASTATIN 10 MG TAB
10 mg | ORAL_TABLET | Freq: Every day | ORAL | 1 refills | Status: AC
Start: 2018-07-09 — End: ?

## 2018-07-09 MED ORDER — ERGOCALCIFEROL (VITAMIN D2) 50,000 UNIT CAP
1250 mcg (50,000 unit) | ORAL_CAPSULE | ORAL | 3 refills | Status: AC
Start: 2018-07-09 — End: ?

## 2018-07-09 MED ORDER — METHOCARBAMOL 500 MG TAB
500 mg | ORAL_TABLET | Freq: Two times a day (BID) | ORAL | 1 refills | Status: AC | PRN
Start: 2018-07-09 — End: ?

## 2018-07-09 NOTE — Progress Notes (Signed)
Identified pt with two pt identifiers(name and DOB).    Chief Complaint   Patient presents with   ??? Medication Refill     Methocarbomal, Gabapentin   ??? Labs     patient is fasting        Health Maintenance Due   Topic   ??? Pneumococcal 0-64 years (1 of 1 - PPSV23)   ??? DTaP/Tdap/Td series (1 - Tdap)   ??? Influenza Age 349 to Adult        Wt Readings from Last 3 Encounters:   07/09/18 201 lb (91.2 kg)   03/08/18 195 lb (88.5 kg)   02/05/18 195 lb 9.6 oz (88.7 kg)     Temp Readings from Last 3 Encounters:   07/09/18 97.6 ??F (36.4 ??C) (Oral)   03/08/18 98.3 ??F (36.8 ??C) (Oral)   11/09/17 98.2 ??F (36.8 ??C) (Oral)     BP Readings from Last 3 Encounters:   07/09/18 122/78   03/08/18 122/80   02/05/18 (!) 132/92     Pulse Readings from Last 3 Encounters:   07/09/18 98   03/08/18 94   02/05/18 82         Learning Assessment:  :     Learning Assessment 11/09/2017   PRIMARY LEARNER Patient   PRIMARY LANGUAGE ENGLISH   LEARNER PREFERENCE PRIMARY DEMONSTRATION   ANSWERED BY self   RELATIONSHIP SELF       Depression Screening:  :     3 most recent PHQ Screens 11/09/2017   Little interest or pleasure in doing things Not at all   Feeling down, depressed, irritable, or hopeless Not at all   Total Score PHQ 2 0       Fall Risk Assessment:  :     No flowsheet data found.    Abuse Screening:  :     Abuse Screening Questionnaire 11/09/2017   Do you ever feel afraid of your partner? N   Are you in a relationship with someone who physically or mentally threatens you? N   Is it safe for you to go home? Y       Coordination of Care Questionnaire:  :     1) Have you been to an emergency room, urgent care clinic since your last visit? no   Hospitalized since your last visit? no             2) Have you seen or consulted any other health care providers outside of [facility] since your last visit? no  (Include any pap smears or colon screenings in this section.)    3) Do you have an Advance Directive on file? no   Are you interested in receiving information about Advance Directives? no    Reviewed record in preparation for visit and have obtained necessary documentation.  Medication reconciliation up to date and corrected with patient at this time.

## 2018-07-09 NOTE — Progress Notes (Signed)
Subjective:      Geoffrey Murphy is a 47 y.o. male here for follow up and medication refill. He is fasting for labs.     GERD: on omeprazole. Reflux symptoms are controlled on this medication. Denies hematemesis, melena, hematochezia, abdominal pain. Avoids foods that trigger his symptoms. He has a bowel movement about every other day and does have to strain to get his stools.     Lumbar DDD: doing fairly well on current therapy. No reported bowel or bladder incontinence, saddle anesthesia, LE paresthesias.     Current Outpatient Medications   Medication Sig Dispense Refill   ??? potassium chloride (K-DUR, KLOR-CON) 20 mEq tablet Take 1 Tab by mouth daily.  1   ??? albuterol (PROVENTIL VENTOLIN) 2.5 mg /3 mL (0.083 %) nebu Take 3 mL by inhalation two (2) times daily as needed (Cough, Shortness of breath, Wheezing). 90 Nebule 3   ??? QUEtiapine (SEROQUEL) 100 mg tablet Take 1 Tab by mouth daily.  1   ??? gabapentin (NEURONTIN) 300 mg capsule Take 1 Cap by mouth three (3) times daily. Max Daily Amount: 900 mg. 270 Cap 1   ??? methocarbamol (ROBAXIN) 500 mg tablet Take 1.5 Tabs by mouth two (2) times daily as needed for Pain (Muscle spasm). 90 Tab 1   ??? meloxicam (MOBIC) 15 mg tablet Take 1 Tab by mouth daily. 90 Tab 1   ??? omeprazole (PRILOSEC) 20 mg capsule Take 1 Cap by mouth daily. 90 Cap 0   ??? atorvastatin (LIPITOR) 10 mg tablet Take 1 Tab by mouth daily. 90 Tab 1   ??? TRELEGY ELLIPTA 100-62.5-25 mcg inhaler INHALE 1 PUFF BY MOUTH ONCE DAILY  12   ??? metoprolol succinate (TOPROL-XL) 25 mg XL tablet Take 1 Tab by mouth daily. 90 Tab 3   ??? buPROPion SR (WELLBUTRIN SR) 150 mg SR tablet Take 1 Tab by mouth two (2) times a day.     ??? aspirin delayed-release 81 mg tablet Take 81 mg by mouth daily.     ??? albuterol (PROAIR HFA) 90 mcg/actuation inhaler Take 2 Puffs by inhalation every four (4) hours as needed for Wheezing. 1 Inhaler 0       No Known Allergies      Past Medical History:   Diagnosis Date    ??? Chronic obstructive pulmonary disease (HCC)    ??? Chronic pain     back pain   ??? Depression    ??? GERD (gastroesophageal reflux disease)    ??? Hypercholesterolemia        Social History     Tobacco Use   ??? Smoking status: Former Smoker     Types: Cigarettes     Last attempt to quit: 09/12/2016     Years since quitting: 1.8   ??? Smokeless tobacco: Never Used   Substance Use Topics   ??? Alcohol use: Yes     Frequency: Monthly or less     Drinks per session: 1 or 2     Binge frequency: Never        Review of Systems  Pertinent items are noted in HPI.     Objective:     Visit Vitals  BP 122/78 (BP 1 Location: Right arm, BP Patient Position: Sitting) Comment: Manual   Pulse 98   Temp 97.6 ??F (36.4 ??C) (Oral) Comment: .   Resp 18   Ht 5\' 8"  (1.727 m)   Wt 201 lb (91.2 kg)   SpO2 95%  BMI 30.56 kg/m??      General appearance - alert, well appearing, and in no distress  Chest - (+) expiratory wheezing, no rales or rhonchi, symmetric air entry, no tachypnea, retractions or cyanosis  Heart - normal rate, regular rhythm, normal S1, S2, no murmurs, rubs, clicks or gallops  Abdomen - soft, nontender, nondistended, normal bowels sounds, reducible umbilical hernia    Assessment/Plan:   Geoffrey Murphy is a 47 y.o. male seen for:     1. Essential hypertension: controlled, continue with current therapy.   - METABOLIC PANEL, COMPREHENSIVE; Future    2. Hypercholesterolemia: continue with statin therapy.   - atorvastatin (LIPITOR) 10 mg tablet; Take 1 Tab by mouth daily.  Dispense: 90 Tab; Refill: 1  - LIPID PANEL; Future  - METABOLIC PANEL, COMPREHENSIVE; Future    3. Gastroesophageal reflux disease, esophagitis presence not specified: stable.   - omeprazole (PRILOSEC) 20 mg capsule; Take 1 Cap by mouth daily.  Dispense: 90 Cap; Refill: 1    4. BMI 35.0-35.9,adult: I have reviewed/discussed the above normal BMI with the patient.  I have recommended the following interventions: dietary  management education, guidance, and counseling and encourage exercise.     5. Vitamin D deficiency  - ergocalciferol (ERGOCALCIFEROL) 50,000 unit capsule; Take 1 Cap by mouth every seven (7) days.  Dispense: 4 Cap; Refill: 3    6. DDD (degenerative disc disease), lumbar: stable.   - methocarbamol (ROBAXIN) 500 mg tablet; Take 1.5 Tabs by mouth two (2) times daily as needed for Pain (Muscle spasm).  Dispense: 90 Tab; Refill: 1    I have discussed the diagnosis with the patient and the intended plan as seen in the above orders. The patient has received an after-visit summary and questions were answered concerning future plans. I have discussed medication side effects and warnings with the patient as well. Patient verbalizes understanding of plan of care and denies further questions or concerns at this time. Informed patient to return to the office if symptoms worsen or if new symptoms arise.       Follow-up and Dispositions    ?? Return in about 4 months (around 11/08/2018) for follow up or sooner as needed.

## 2018-07-09 NOTE — Patient Instructions (Signed)
Gas and Bloating: Care Instructions  Your Care Instructions    Gas and bloating can be uncomfortable and embarrassing problems. All people pass gas, but some people produce more gas than others, sometimes enough to cause distress. It is normal to pass gas from 6 to 20 times per day. Excess gas usually is not caused by a serious health problem.  Gas and bloating usually are caused by something you eat or drink, including some food supplements and medicines.  Gas and bloating are usually harmless and go away without treatment. However, changing your diet can help end the problem. Some over-the-counter medicines can help prevent gas and relieve bloating.  Follow-up care is a key part of your treatment and safety. Be sure to make and go to all appointments, and call your doctor if you are having problems. It's also a good idea to know your test results and keep a list of the medicines you take.  How can you care for yourself at home?  ?? Keep a food diary if you think a food gives you gas. Write down what you eat or drink. Also record when you get gas. If you notice that a food seems to cause your gas each time, avoid it and see if the gas goes away. Examples of foods that cause gas include:  ? Fried and fatty foods.  ? Beans.  ? Vegetables such as artichokes, asparagus, broccoli, brussels sprouts, cabbage, cauliflower, cucumbers, green peppers, onions, peas, radishes, and raw potatoes.  ? Fruits such as apricots, bananas, melons, peaches, pears, prunes, and raw apples.  ? Wheat and wheat bran.  ?? Soak dry beans in water overnight, then dump the water and cook the soaked beans in new water. This can help prevent gas and bloating.  ?? If you have problems with lactose, avoid dairy products such as milk and cheese.  ?? Try not to swallow air. Do not drink through a straw, gulp your food, or chew gum.  ?? Take an over-the-counter medicine. Read and follow all instructions on the label.   ? Food enzymes, such as Beano, can be added to gas-producing foods to prevent gas.  ? Antacids, such as Maalox Anti-Gas and Mylanta Gas, can relieve bloating by making you burp. Be careful when you take over-the-counter antacid medicines. Many of these medicines have aspirin in them. Read the label to make sure that you are not taking more than the recommended dose. Too much aspirin can be harmful.  ? Activated charcoal tablets, such as CharcoCaps, may decrease odor from gas you pass.  ? If you have problems with lactose, you can take medicines such as Dairy Ease and Lactaid with dairy products to prevent gas and bloating.  ?? Get some exercise regularly.  When should you call for help?  Call 911 anytime you think you may need emergency care. For example, call if:  ?? ?? You have gas and signs of a heart attack, such as:  ? Chest pain or pressure.  ? Sweating.  ? Shortness of breath.  ? Nausea or vomiting.  ? Pain that spreads from the chest to the neck, jaw, or one or both shoulders or arms.  ? Dizziness or lightheadedness.  ? A fast or uneven pulse.  After calling 911, chew 1 adult-strength aspirin. Wait for an ambulance. Do not try to drive yourself.   ??Call your doctor now or seek immediate medical care if:  ?? ?? You have severe belly pain.   ?? ?? You   have blood in your stool.   ??Watch closely for changes in your health, and be sure to contact your doctor if:  ?? ?? You have blood or pus in your urine.   ?? ?? Your urine is cloudy or smells bad.   ?? ?? You are burping and have trouble swallowing.   ?? ?? You feel bloated and have swelling in your belly.   ?? ?? You do not get better as expected.   Where can you learn more?  Go to http://www.healthwise.net/[employer].  Enter W582 in the search box to learn more about "Gas and Bloating: Care Instructions."  Current as of: January 30, 2018  Content Version: 12.2  ?? 2006-2019 [employer]. Care instructions adapted under  license by [employer] (which disclaims liability or warranty for this information). If you have questions about a medical condition or this instruction, always ask your healthcare professional. Healthwise, Incorporated disclaims any warranty or liability for your use of this information.

## 2018-07-09 NOTE — Progress Notes (Signed)
 Identified pt with two pt identifiers(name and DOB).    Chief Complaint   Patient presents with   . Medication Refill     Methocarbomal, Gabapentin   . Labs     patient is fasting        Health Maintenance Due   Topic   . Pneumococcal 0-64 years (1 of 1 - PPSV23)   . DTaP/Tdap/Td series (1 - Tdap)   . Influenza Age 47 to Adult        Wt Readings from Last 3 Encounters:   07/09/18 201 lb (91.2 kg)   03/08/18 195 lb (88.5 kg)   02/05/18 195 lb 9.6 oz (88.7 kg)     Temp Readings from Last 3 Encounters:   07/09/18 97.6 F (36.4 C) (Oral)   03/08/18 98.3 F (36.8 C) (Oral)   11/09/17 98.2 F (36.8 C) (Oral)     BP Readings from Last 3 Encounters:   07/09/18 122/78   03/08/18 122/80   02/05/18 (!) 132/92     Pulse Readings from Last 3 Encounters:   07/09/18 98   03/08/18 94   02/05/18 82         Learning Assessment:  :     Learning Assessment 11/09/2017   PRIMARY LEARNER Patient   PRIMARY LANGUAGE ENGLISH   LEARNER PREFERENCE PRIMARY DEMONSTRATION   ANSWERED BY self   RELATIONSHIP SELF       Depression Screening:  :     3 most recent PHQ Screens 11/09/2017   Little interest or pleasure in doing things Not at all   Feeling down, depressed, irritable, or hopeless Not at all   Total Score PHQ 2 0       Fall Risk Assessment:  :     No flowsheet data found.    Abuse Screening:  :     Abuse Screening Questionnaire 11/09/2017   Do you ever feel afraid of your partner? N   Are you in a relationship with someone who physically or mentally threatens you? N   Is it safe for you to go home? Y       Coordination of Care Questionnaire:  :     1) Have you been to an emergency room, urgent care clinic since your last visit? no   Hospitalized since your last visit? no             2) Have you seen or consulted any other health care providers outside of [facility] since your last visit? no  (Include any pap smears or colon screenings in this section.)    3) Do you have an Advance Directive on file? no  Are you interested in  receiving information about Advance Directives? no    Reviewed record in preparation for visit and have obtained necessary documentation.  Medication reconciliation up to date and corrected with patient at this time.

## 2018-07-09 NOTE — Progress Notes (Signed)
Subjective:      Geoffrey Murphy is a 47 y.o. male here for follow up and medication refill. He is fasting for labs.     GERD: on omeprazole. Reflux symptoms are controlled on this medication. Denies hematemesis, melena, hematochezia, abdominal pain. Avoids foods that trigger his symptoms. He has a bowel movement about every other day and does have to strain to get his stools.     Lumbar DDD: doing fairly well on current therapy. No reported bowel or bladder incontinence, saddle anesthesia, LE paresthesias.     Current Outpatient Medications   Medication Sig Dispense Refill   ??? potassium chloride (K-DUR, KLOR-CON) 20 mEq tablet Take 1 Tab by mouth daily.  1   ??? albuterol (PROVENTIL VENTOLIN) 2.5 mg /3 mL (0.083 %) nebu Take 3 mL by inhalation two (2) times daily as needed (Cough, Shortness of breath, Wheezing). 90 Nebule 3   ??? QUEtiapine (SEROQUEL) 100 mg tablet Take 1 Tab by mouth daily.  1   ??? gabapentin (NEURONTIN) 300 mg capsule Take 1 Cap by mouth three (3) times daily. Max Daily Amount: 900 mg. 270 Cap 1   ??? methocarbamol (ROBAXIN) 500 mg tablet Take 1.5 Tabs by mouth two (2) times daily as needed for Pain (Muscle spasm). 90 Tab 1   ??? meloxicam (MOBIC) 15 mg tablet Take 1 Tab by mouth daily. 90 Tab 1   ??? omeprazole (PRILOSEC) 20 mg capsule Take 1 Cap by mouth daily. 90 Cap 0   ??? atorvastatin (LIPITOR) 10 mg tablet Take 1 Tab by mouth daily. 90 Tab 1   ??? TRELEGY ELLIPTA 100-62.5-25 mcg inhaler INHALE 1 PUFF BY MOUTH ONCE DAILY  12   ??? metoprolol succinate (TOPROL-XL) 25 mg XL tablet Take 1 Tab by mouth daily. 90 Tab 3   ??? buPROPion SR (WELLBUTRIN SR) 150 mg SR tablet Take 1 Tab by mouth two (2) times a day.     ??? aspirin delayed-release 81 mg tablet Take 81 mg by mouth daily.     ??? albuterol (PROAIR HFA) 90 mcg/actuation inhaler Take 2 Puffs by inhalation every four (4) hours as needed for Wheezing. 1 Inhaler 0       No Known Allergies      Past Medical History:   Diagnosis Date   ??? Chronic obstructive  pulmonary disease (HCC)    ??? Chronic pain     back pain   ??? Depression    ??? GERD (gastroesophageal reflux disease)    ??? Hypercholesterolemia        Social History     Tobacco Use   ??? Smoking status: Former Smoker     Types: Cigarettes     Last attempt to quit: 09/12/2016     Years since quitting: 1.8   ??? Smokeless tobacco: Never Used   Substance Use Topics   ??? Alcohol use: Yes     Frequency: Monthly or less     Drinks per session: 1 or 2     Binge frequency: Never        Review of Systems  Pertinent items are noted in HPI.     Objective:     Visit Vitals  BP 122/78 (BP 1 Location: Right arm, BP Patient Position: Sitting) Comment: Manual   Pulse 98   Temp 97.6 ??F (36.4 ??C) (Oral) Comment: .   Resp 18   Ht 5\' 8"  (1.727 m)   Wt 201 lb (91.2 kg)   SpO2 95%  BMI 30.56 kg/m??      General appearance - alert, well appearing, and in no distress  Chest - (+) expiratory wheezing, no rales or rhonchi, symmetric air entry, no tachypnea, retractions or cyanosis  Heart - normal rate, regular rhythm, normal S1, S2, no murmurs, rubs, clicks or gallops  Abdomen - soft, nontender, nondistended, normal bowels sounds, reducible umbilical hernia    Assessment/Plan:   Geoffrey Murphy is a 47 y.o. male seen for:     1. Essential hypertension: controlled, continue with current therapy.   - METABOLIC PANEL, COMPREHENSIVE; Future    2. Hypercholesterolemia: continue with statin therapy.   - atorvastatin (LIPITOR) 10 mg tablet; Take 1 Tab by mouth daily.  Dispense: 90 Tab; Refill: 1  - LIPID PANEL; Future  - METABOLIC PANEL, COMPREHENSIVE; Future    3. Gastroesophageal reflux disease, esophagitis presence not specified: stable.   - omeprazole (PRILOSEC) 20 mg capsule; Take 1 Cap by mouth daily.  Dispense: 90 Cap; Refill: 1    4. BMI 35.0-35.9,adult: I have reviewed/discussed the above normal BMI with the patient.  I have recommended the following interventions: dietary management education, guidance, and counseling and encourage exercise.      5. Vitamin D deficiency  - ergocalciferol (ERGOCALCIFEROL) 50,000 unit capsule; Take 1 Cap by mouth every seven (7) days.  Dispense: 4 Cap; Refill: 3    6. DDD (degenerative disc disease), lumbar: stable.   - methocarbamol (ROBAXIN) 500 mg tablet; Take 1.5 Tabs by mouth two (2) times daily as needed for Pain (Muscle spasm).  Dispense: 90 Tab; Refill: 1    I have discussed the diagnosis with the patient and the intended plan as seen in the above orders. The patient has received an after-visit summary and questions were answered concerning future plans. I have discussed medication side effects and warnings with the patient as well. Patient verbalizes understanding of plan of care and denies further questions or concerns at this time. Informed patient to return to the office if symptoms worsen or if new symptoms arise.       Follow-up and Dispositions    ?? Return in about 4 months (around 11/08/2018) for follow up or sooner as needed.

## 2018-07-10 LAB — COMPREHENSIVE METABOLIC PANEL
ALT: 68 U/L (ref 12–78)
AST: 30 U/L (ref 15–37)
Albumin/Globulin Ratio: 1.1 (ref 1.1–2.2)
Albumin: 3.9 g/dL (ref 3.5–5.0)
Alkaline Phosphatase: 112 U/L (ref 45–117)
Anion Gap: 4 mmol/L — ABNORMAL LOW (ref 5–15)
BUN: 7 MG/DL (ref 6–20)
Bun/Cre Ratio: 6 — ABNORMAL LOW (ref 12–20)
CO2: 27 mmol/L (ref 21–32)
Calcium: 9.4 MG/DL (ref 8.5–10.1)
Chloride: 106 mmol/L (ref 97–108)
Creatinine: 1.11 MG/DL (ref 0.70–1.30)
EGFR IF NonAfrican American: >60 mL/min/{1.73_m2} (ref >60)
GFR African American: >60 mL/min/{1.73_m2} (ref >60)
Globulin: 3.7 g/dL (ref 2.0–4.0)
Glucose: 97 mg/dL (ref 65–100)
Potassium: 4.1 mmol/L (ref 3.5–5.1)
Sodium: 137 mmol/L (ref 136–145)
Total Bilirubin: 0.3 MG/DL (ref 0.2–1.0)
Total Protein: 7.6 g/dL (ref 6.4–8.2)

## 2018-07-10 LAB — LIPID PANEL
CHOL/HDL Ratio: 5.7 — ABNORMAL HIGH (ref 0.0–5.0)
Chol/HDL Ratio: 5.7 — ABNORMAL HIGH (ref 0.0–5.0)
Cholesterol, Total: 195 MG/DL (ref <200)
Cholesterol, total: 195 MG/DL (ref <200)
HDL Cholesterol: 34 MG/DL
HDL: 34 MG/DL
LDL Calculated: 115.4 MG/DL — ABNORMAL HIGH (ref 0–100)
LDL, calculated: 115.4 MG/DL — ABNORMAL HIGH (ref 0–100)
Triglyceride: 228 MG/DL — ABNORMAL HIGH (ref <150)
Triglycerides: 228 MG/DL — ABNORMAL HIGH (ref <150)
VLDL Cholesterol Calculated: 45.6 MG/DL
VLDL, calculated: 45.6 MG/DL

## 2018-07-10 LAB — METABOLIC PANEL, COMPREHENSIVE
A-G Ratio: 1.1 (ref 1.1–2.2)
ALT (SGPT): 68 U/L (ref 12–78)
AST (SGOT): 30 U/L (ref 15–37)
Albumin: 3.9 g/dL (ref 3.5–5.0)
Alk. phosphatase: 112 U/L (ref 45–117)
Anion gap: 4 mmol/L — ABNORMAL LOW (ref 5–15)
BUN/Creatinine ratio: 6 — ABNORMAL LOW (ref 12–20)
BUN: 7 MG/DL (ref 6–20)
Bilirubin, total: 0.3 MG/DL (ref 0.2–1.0)
CO2: 27 mmol/L (ref 21–32)
Calcium: 9.4 MG/DL (ref 8.5–10.1)
Chloride: 106 mmol/L (ref 97–108)
Creatinine: 1.11 MG/DL (ref 0.70–1.30)
GFR est AA: >60 mL/min/{1.73_m2} (ref >60)
GFR est non-AA: >60 mL/min/{1.73_m2} (ref >60)
Globulin: 3.7 g/dL (ref 2.0–4.0)
Glucose: 97 mg/dL (ref 65–100)
Potassium: 4.1 mmol/L (ref 3.5–5.1)
Protein, total: 7.6 g/dL (ref 6.4–8.2)
Sodium: 137 mmol/L (ref 136–145)

## 2018-09-13 NOTE — Telephone Encounter (Signed)
Geoffrey Murphy with [employer] is calling asking if the pt can get a order for a shower chair.    Any questions please call her at [phone]

## 2018-09-13 NOTE — Telephone Encounter (Signed)
Called Janelle Floor and let her know to call insurance company and see which DMS to send this too

## 2018-10-11 ENCOUNTER — Encounter

## 2018-10-16 MED ORDER — GABAPENTIN 300 MG CAP
300 mg | ORAL_CAPSULE | ORAL | 5 refills | Status: AC
Start: 2018-10-16 — End: ?

## 2018-12-14 ENCOUNTER — Emergency Department (HOSPITAL_COMMUNITY)
Admission: EM | Admit: 2018-12-14 | Discharge: 2018-12-14 | Disposition: A | Discharge status: Home / Self Care | Payer: Medicaid - Out of State | Attending: Emergency Medicine | Admitting: Emergency Medicine

## 2018-12-14 ENCOUNTER — Other Ambulatory Visit: Payer: Self-pay

## 2018-12-14 ENCOUNTER — Encounter (HOSPITAL_COMMUNITY): Payer: Self-pay

## 2018-12-14 ENCOUNTER — Emergency Department (HOSPITAL_COMMUNITY): Payer: Medicaid - Out of State

## 2018-12-14 DIAGNOSIS — I10 Essential (primary) hypertension: Secondary | ICD-10-CM | POA: Diagnosis not present

## 2018-12-14 DIAGNOSIS — J441 Chronic obstructive pulmonary disease with (acute) exacerbation: Secondary | ICD-10-CM | POA: Insufficient documentation

## 2018-12-14 DIAGNOSIS — Z20828 Contact with and (suspected) exposure to other viral communicable diseases: Secondary | ICD-10-CM | POA: Insufficient documentation

## 2018-12-14 DIAGNOSIS — R0602 Shortness of breath: Secondary | ICD-10-CM | POA: Diagnosis present

## 2018-12-14 DIAGNOSIS — Z87891 Personal history of nicotine dependence: Secondary | ICD-10-CM | POA: Diagnosis not present

## 2018-12-14 HISTORY — DX: Chronic obstructive pulmonary disease, unspecified: J44.9

## 2018-12-14 HISTORY — DX: Hyperlipidemia, unspecified: E78.5

## 2018-12-14 HISTORY — DX: Essential (primary) hypertension: I10

## 2018-12-14 LAB — CBC WITH DIFFERENTIAL/PLATELET
Abs Immature Granulocytes: 0.01 10*3/uL (ref 0.00–0.07)
Basophils Absolute: 0.1 10*3/uL (ref 0.0–0.1)
Basophils Relative: 1 %
Eosinophils Absolute: 0.1 10*3/uL (ref 0.0–0.5)
Eosinophils Relative: 1 %
HCT: 53 % — ABNORMAL HIGH (ref 39.0–52.0)
Hemoglobin: 18.1 g/dL — ABNORMAL HIGH (ref 13.0–17.0)
Immature Granulocytes: 0 %
Lymphocytes Relative: 43 %
Lymphs Abs: 3 10*3/uL (ref 0.7–4.0)
MCH: 33.2 pg (ref 26.0–34.0)
MCHC: 34.2 g/dL (ref 30.0–36.0)
MCV: 97.1 fL (ref 80.0–100.0)
Monocytes Absolute: 0.6 10*3/uL (ref 0.1–1.0)
Monocytes Relative: 8 %
Neutro Abs: 3.2 10*3/uL (ref 1.7–7.7)
Neutrophils Relative %: 47 %
Platelets: 205 10*3/uL (ref 150–400)
RBC: 5.46 MIL/uL (ref 4.22–5.81)
RDW: 14.1 % (ref 11.5–15.5)
WBC: 6.9 10*3/uL (ref 4.0–10.5)
nRBC: 0 % (ref 0.0–0.2)

## 2018-12-14 LAB — BRAIN NATRIURETIC PEPTIDE: B Natriuretic Peptide: 17 pg/mL (ref 0.0–100.0)

## 2018-12-14 LAB — COMPREHENSIVE METABOLIC PANEL
ALT: 32 U/L (ref 0–44)
AST: 34 U/L (ref 15–41)
Albumin: 3.9 g/dL (ref 3.5–5.0)
Alkaline Phosphatase: 87 U/L (ref 38–126)
Anion gap: 14 (ref 5–15)
BUN: 5 mg/dL — ABNORMAL LOW (ref 6–20)
CO2: 19 mmol/L — ABNORMAL LOW (ref 22–32)
Calcium: 8.6 mg/dL — ABNORMAL LOW (ref 8.9–10.3)
Chloride: 110 mmol/L (ref 98–111)
Creatinine, Ser: 0.58 mg/dL — ABNORMAL LOW (ref 0.61–1.24)
GFR calc Af Amer: >60 mL/min (ref >60)
GFR calc non Af Amer: >60 mL/min (ref >60)
Glucose, Bld: 92 mg/dL (ref 70–99)
Potassium: 3.6 mmol/L (ref 3.5–5.1)
Sodium: 143 mmol/L (ref 135–145)
Total Bilirubin: 0.7 mg/dL (ref 0.3–1.2)
Total Protein: 7.1 g/dL (ref 6.5–8.1)

## 2018-12-14 LAB — TROPONIN I: Troponin I: <0.03 ng/mL (ref <0.03)

## 2018-12-14 LAB — SARS CORONAVIRUS 2 BY RT PCR (HOSPITAL ORDER, PERFORMED IN ~~LOC~~ HOSPITAL LAB): SARS Coronavirus 2: NEGATIVE

## 2018-12-14 MED ORDER — ALBUTEROL SULFATE HFA 108 (90 BASE) MCG/ACT IN AERS
2.0000 | INHALATION_SPRAY | RESPIRATORY_TRACT | Status: DC | PRN
  Administered 2018-12-14: 2 via RESPIRATORY_TRACT
  Filled 2018-12-14: qty 6.7

## 2018-12-14 MED ORDER — METHYLPREDNISOLONE SODIUM SUCC 125 MG IJ SOLR
125.0000 mg | Freq: Once | INTRAMUSCULAR | Status: AC
  Administered 2018-12-14: 125 mg via INTRAVENOUS
  Filled 2018-12-14: qty 2

## 2018-12-14 MED ORDER — MAGNESIUM SULFATE 2 GM/50ML IV SOLN
2.0000 g | Freq: Once | INTRAVENOUS | Status: AC
  Administered 2018-12-14: 2 g via INTRAVENOUS
  Filled 2018-12-14: qty 50

## 2018-12-14 MED ORDER — PREDNISONE 20 MG PO TABS: ORAL_TABLET | ORAL | 0 refills | Status: DC

## 2018-12-14 NOTE — ED Provider Notes (Signed)
[facility] EMERGENCY DEPARTMENT Provider Note   CSN: [id number] Arrival date & time: 12/14/18  0445    History   Chief Complaint Chief Complaint  Patient presents with  . Shortness of Breath    HPI Christian Whitaker is a 48 y.o. male.     Patient complains of shortness of breath and wheezing.  Patient states he does not have his nebulizer machine anymore.  The history is provided by the patient.  Shortness of Breath  Severity:  Moderate Onset quality:  Sudden Timing:  Constant Progression:  Worsening Chronicity:  Recurrent Context: not activity   Relieved by:  Nothing Worsened by:  Nothing Ineffective treatments:  None tried Associated symptoms: no abdominal pain, no chest pain, no cough, no headaches and no rash     Past Medical History:  Diagnosis Date  . COPD (chronic obstructive pulmonary disease) (HCC)   . Hyperlipidemia   . Hypertension     There are no active problems to display for this patient.   History reviewed. No pertinent surgical history.      Home Medications    Prior to Admission medications   Medication Sig Start Date End Date Taking? Authorizing Provider  predniSONE (DELTASONE) 20 MG tablet 2 tabs po daily x 3 days 12/14/18   Bethann BerkshireZammit, Daryel Kenneth, MD    Family History No family history on file.  Social History Social History   Tobacco Use  . Smoking status: Former Games developermoker  . Smokeless tobacco: Never Used  Substance Use Topics  . Alcohol use: Yes  . Drug use: Never     Allergies   Patient has no known allergies.   Review of Systems Review of Systems  Constitutional: Negative for appetite change and fatigue.  HENT: Negative for congestion, ear discharge and sinus pressure.   Eyes: Negative for discharge.  Respiratory: Positive for shortness of breath. Negative for cough.   Cardiovascular: Negative for chest pain.  Gastrointestinal: Negative for abdominal pain and diarrhea.  Genitourinary: Negative for frequency and  hematuria.  Musculoskeletal: Negative for back pain.  Skin: Negative for rash.  Neurological: Negative for seizures and headaches.  Psychiatric/Behavioral: Negative for hallucinations.     Physical Exam Updated Vital Signs BP (!) 159/91   Pulse 80   Temp 97.7 F (36.5 C) (Oral)   Resp 15   Ht 5\' 8"  (1.727 m)   Wt 79.4 kg   SpO2 98%   BMI 26.61 kg/m   Physical Exam Vitals signs and nursing note reviewed.  Constitutional:      Appearance: He is well-developed.  HENT:     Head: Normocephalic.     Nose: Nose normal.  Eyes:     General: No scleral icterus.    Conjunctiva/sclera: Conjunctivae normal.  Neck:     Musculoskeletal: Neck supple.     Thyroid: No thyromegaly.  Cardiovascular:     Rate and Rhythm: Normal rate and regular rhythm.     Heart sounds: No murmur. No friction rub. No gallop.   Pulmonary:     Breath sounds: No stridor. Wheezing present. No rales.  Chest:     Chest wall: No tenderness.  Abdominal:     General: There is no distension.     Tenderness: There is no abdominal tenderness. There is no rebound.  Musculoskeletal: Normal range of motion.  Lymphadenopathy:     Cervical: No cervical adenopathy.  Skin:    Findings: No erythema or rash.  Neurological:     Mental Status: He is  alert and oriented to person, place, and time.     Motor: No abnormal muscle tone.     Coordination: Coordination normal.  Psychiatric:        Behavior: Behavior normal.      ED Treatments / Results  Labs (all labs ordered are listed, but only abnormal results are displayed) Labs Reviewed  CBC WITH DIFFERENTIAL/PLATELET - Abnormal; Notable for the following components:      Result Value   Hemoglobin 18.1 (*)    HCT 53.0 (*)    All other components within normal limits  COMPREHENSIVE METABOLIC PANEL - Abnormal; Notable for the following components:   CO2 19 (*)    BUN 5 (*)    Creatinine, Ser 0.58 (*)    Calcium 8.6 (*)    All other components within normal  limits  SARS CORONAVIRUS 2 (HOSPITAL ORDER, PERFORMED IN [facility])  TROPONIN I  BRAIN NATRIURETIC PEPTIDE    EKG None  Radiology Dg Chest Portable 1 View  Result Date: 12/14/2018 CLINICAL DATA:  SOB onset last night, chronic back pain, history of copdsob EXAM: PORTABLE CHEST 1 VIEW COMPARISON:  None. FINDINGS: Normal mediastinum and cardiac silhouette. Normal pulmonary vasculature. No evidence of effusion, infiltrate, or pneumothorax. No acute bony abnormality. IMPRESSION: Normal chest radiograph. Electronically Signed   By: Genevive Bi M.D.   On: 12/14/2018 05:33    Procedures Procedures (including critical care time)  Medications Ordered in ED Medications  albuterol (VENTOLIN HFA) 108 (90 Base) MCG/ACT inhaler 2 puff (has no administration in time range)  methylPREDNISolone sodium succinate (SOLU-MEDROL) 125 mg/2 mL injection 125 mg (125 mg Intravenous Given 12/14/18 0531)  magnesium sulfate IVPB 2 g 50 mL ( Intravenous Stopped 12/14/18 0620)     Initial Impression / Assessment and Plan / ED Course  I have reviewed the triage vital signs and the nursing notes.  Pertinent labs & imaging results that were available during my care of the patient were reviewed by me and considered in my medical decision making (see chart for details).        Labs unremarkable.  Patient improved with albuterol inhaler along with steroids.  Patient will continue his steroids and will be placed on p.o. steroids and follow-up with a family doctor or return to the emergency department Final Clinical Impressions(s) / ED Diagnoses   Final diagnoses:  COPD exacerbation [facility])    ED Discharge Orders         Ordered    predniSONE (DELTASONE) 20 MG tablet     12/14/18 8341           Bethann Berkshire, MD 12/14/18 [phone]

## 2018-12-14 NOTE — Discharge Instructions (Addendum)
Use your Ventolin inhaler every 6 hours for shortness of breath.  Follow-up with a family doctor next week for recheck

## 2018-12-14 NOTE — ED Triage Notes (Signed)
Pt states he just moved to [place] a couple of weeks ago and has not established a primary care doctor here, but gets his RX at [facility]

## 2018-12-14 NOTE — ED Triage Notes (Signed)
Sob onset last night, chronic back pain, history of copd

## 2019-02-10 ENCOUNTER — Encounter: Attending: Internal Medicine | Primary: Family Medicine

## 2019-02-28 ENCOUNTER — Encounter: Attending: Internal Medicine | Primary: Family Medicine

## 2019-02-28 NOTE — Progress Notes (Unsigned)
No-show

## 2019-03-03 NOTE — Telephone Encounter (Signed)
Called in #30 day of metoprolol per Dr Donnella Bi.

## 2019-03-03 NOTE — Telephone Encounter (Signed)
Patient is requesting to speak with nurse regarding medication refills being denied.     Phone: [phone]

## 2019-06-30 ENCOUNTER — Ambulatory Visit: Payer: PRIVATE HEALTH INSURANCE | Attending: Internal Medicine | Primary: Family Medicine

## 2020-12-21 ENCOUNTER — Ambulatory Visit (INDEPENDENT_AMBULATORY_CARE_PROVIDER_SITE_OTHER): Payer: Medicaid Other | Admitting: Internal Medicine

## 2020-12-21 ENCOUNTER — Other Ambulatory Visit: Payer: Self-pay

## 2020-12-21 ENCOUNTER — Encounter (INDEPENDENT_AMBULATORY_CARE_PROVIDER_SITE_OTHER): Payer: Self-pay

## 2020-12-21 ENCOUNTER — Encounter (INDEPENDENT_AMBULATORY_CARE_PROVIDER_SITE_OTHER): Payer: Self-pay | Admitting: Internal Medicine

## 2020-12-21 VITALS — BP 149/88 | HR 89 | Temp 98.8°F | Resp 18 | Ht 66.0 in | Wt 135.8 lb

## 2020-12-21 DIAGNOSIS — G8929 Other chronic pain: Secondary | ICD-10-CM | POA: Insufficient documentation

## 2020-12-21 DIAGNOSIS — E782 Mixed hyperlipidemia: Secondary | ICD-10-CM

## 2020-12-21 DIAGNOSIS — E559 Vitamin D deficiency, unspecified: Secondary | ICD-10-CM | POA: Diagnosis not present

## 2020-12-21 DIAGNOSIS — I10 Essential (primary) hypertension: Secondary | ICD-10-CM | POA: Diagnosis not present

## 2020-12-21 DIAGNOSIS — F32A Depression, unspecified: Secondary | ICD-10-CM | POA: Insufficient documentation

## 2020-12-21 DIAGNOSIS — E7800 Pure hypercholesterolemia, unspecified: Secondary | ICD-10-CM | POA: Insufficient documentation

## 2020-12-21 DIAGNOSIS — J449 Chronic obstructive pulmonary disease, unspecified: Secondary | ICD-10-CM

## 2020-12-21 DIAGNOSIS — K219 Gastro-esophageal reflux disease without esophagitis: Secondary | ICD-10-CM | POA: Insufficient documentation

## 2020-12-21 DIAGNOSIS — Z125 Encounter for screening for malignant neoplasm of prostate: Secondary | ICD-10-CM

## 2020-12-21 MED ORDER — TRELEGY ELLIPTA 100-62.5-25 MCG/INH IN AEPB: 1.0000 | INHALATION_SPRAY | Freq: Every day | RESPIRATORY_TRACT | 3 refills | Status: DC

## 2020-12-21 MED ORDER — ALBUTEROL SULFATE HFA 108 (90 BASE) MCG/ACT IN AERS: 2.0000 | INHALATION_SPRAY | Freq: Four times a day (QID) | RESPIRATORY_TRACT | 3 refills | Status: AC | PRN

## 2020-12-21 MED ORDER — CITALOPRAM HYDROBROMIDE 20 MG PO TABS: 20.0000 mg | ORAL_TABLET | Freq: Every day | ORAL | 3 refills | Status: AC

## 2020-12-21 MED ORDER — GABAPENTIN 300 MG PO CAPS: 300.0000 mg | ORAL_CAPSULE | Freq: Four times a day (QID) | ORAL | 3 refills | Status: AC

## 2020-12-21 MED ORDER — TRAZODONE HCL 100 MG PO TABS: 100.0000 mg | ORAL_TABLET | Freq: Every day | ORAL | 3 refills | Status: AC

## 2020-12-21 MED ORDER — OMEPRAZOLE 20 MG PO CPDR: 1.0000 | DELAYED_RELEASE_CAPSULE | Freq: Every day | ORAL | 3 refills | Status: AC

## 2020-12-21 MED ORDER — METOPROLOL SUCCINATE ER 25 MG PO TB24: 1.0000 | ORAL_TABLET | Freq: Every day | ORAL | 3 refills | Status: AC

## 2020-12-21 MED ORDER — ATORVASTATIN CALCIUM 10 MG PO TABS: 1.0000 | ORAL_TABLET | Freq: Every day | ORAL | 3 refills | Status: AC

## 2020-12-21 NOTE — Progress Notes (Signed)
Metrics: Intervention Frequency ACO  Documented Smoking Status Yearly  Screened one or more times in 24 months  Cessation Counseling or  Active cessation medication Past 24 months  Past 24 months   Guideline developer: UpToDate (See UpToDate for funding source) Date Released: 2014       Wellness Office Visit  Subjective:  Patient ID: Christian Whitaker, male    DOB: 15-Jun-1971  Age: 50 y.o. MRN: [id number]  CC: This 50 year old man comes in as a new patient to establish care. HPI  He moved from [place] to [place] approximately 18 months ago.  He has not found a new primary care physician since he has been here until now. He has inhalers for COPD and he needs to see a pulmonologist. He continues to take metoprolol for his hypertension and has not been taking any for several weeks apparently. He continues on statin therapy for history of hyperlipidemia.  He has no history of coronary artery disease or cerebrovascular disease. He continues on Celexa for depression. Past Medical History:  Diagnosis Date  . COPD (chronic obstructive pulmonary disease) (HCC)   . Hyperlipidemia   . Hypertension    History reviewed. No pertinent surgical history.   History reviewed. No pertinent family history.  Social History   Social History Narrative   Moved from [place] to [place] 2021.Single,lives alone.Unemployed.   Social History   Tobacco Use  . Smoking status: Current Some Day Smoker    Packs/day: 0.50    Types: Cigarettes  . Smokeless tobacco: Former Neurosurgeon    Types: Chew  . Tobacco comment: did long ago; he only smokewhen he is stressed about something.   Substance Use Topics  . Alcohol use: Yes    Comment: occ    Current Meds  Medication Sig  . albuterol (PROVENTIL) (2.5 MG/3ML) 0.083% nebulizer solution Inhale into the lungs.  Marland Kitchen aspirin 81 MG EC tablet Take by mouth.  . ergocalciferol (VITAMIN D2) 1.25 MG (50000 UT) capsule Take 1 capsule by mouth every 7 (seven) days.   . mirtazapine (REMERON) 15 MG tablet Take 1 tablet by mouth daily.  . potassium chloride SA (KLOR-CON) 20 MEQ tablet Take 1 tablet by mouth daily.  . [DISCONTINUED] albuterol (VENTOLIN HFA) 108 (90 Base) MCG/ACT inhaler Inhale into the lungs.  . [DISCONTINUED] atorvastatin (LIPITOR) 10 MG tablet Take 1 tablet by mouth daily.  . [DISCONTINUED] citalopram (CELEXA) 20 MG tablet Take 20 mg by mouth daily.  . [DISCONTINUED] Fluticasone-Umeclidin-Vilant (TRELEGY ELLIPTA) 100-62.5-25 MCG/INH AEPB INHALE 1 PUFF BY MOUTH ONCE DAILY  . [DISCONTINUED] gabapentin (NEURONTIN) 300 MG capsule TAKE 1 CAPSULE BY MOUTH THREE TIMES DAILY . DO NOT EXCEED 3 PER 24 HOURS  . [DISCONTINUED] meloxicam (MOBIC) 15 MG tablet Take 1 tablet by mouth daily.  . [DISCONTINUED] methocarbamol (ROBAXIN) 500 MG tablet Take 1 tablet by mouth at bedtime.  . [DISCONTINUED] metoprolol succinate (TOPROL-XL) 25 MG 24 hr tablet Take 1 tablet by mouth daily.  . [DISCONTINUED] omeprazole (PRILOSEC) 20 MG capsule Take 1 capsule by mouth daily.  . [DISCONTINUED] predniSONE (DELTASONE) 20 MG tablet 2 tabs po daily x 3 days  . [DISCONTINUED] traZODone (DESYREL) 100 MG tablet Take 100 mg by mouth at bedtime.       Objective:   Today's Vitals: BP (!) 149/88 (BP Location: Right Arm, Patient Position: Sitting, Cuff Size: Normal)   Pulse 89   Temp 98.8 F (37.1 C) (Temporal)   Resp 18   Ht 5\' 6"  (1.676 m)   Wt  135 lb 12.8 oz (61.6 kg)   SpO2 98%   BMI 21.92 kg/m  Vitals with BMI 12/21/2020 12/14/2018 12/14/2018  Height 5\' 6"  - -  Weight 135 lbs 13 oz - -  BMI 21.93 - -  Systolic 149 125 -  Diastolic 88 95 -  Pulse 89 72 80     Physical Exam   Looks systemically well.  Blood pressure is elevated today.  However, he is alert and oriented without any focal logical signs.    Assessment   1. Chronic obstructive pulmonary disease, unspecified COPD type (HCC)   2. Mixed hyperlipidemia   3. Essential hypertension, benign   4.  Vitamin D deficiency disease   5. Special screening for malignant neoplasm of prostate       Tests ordered Orders Placed This Encounter  Procedures  . CBC  . COMPLETE METABOLIC PANEL WITH GFR  . Lipid panel  . VITAMIN D 25 Hydroxy (Vit-D Deficiency, Fractures)  . TSH  . PSA, Total with Reflex to PSA, Free  . Ambulatory referral to Pulmonology     Plan: 1. Continue with medications for COPD.  Continues to smoke approximately 10 cigarettes/day.  I will refer him to pulmonology. 2. Continue with metoprolol for hypertension which is uncontrolled for the time being as he has not been taking metoprolol. 3. Continue with statin therapy. 4. Blood work is ordered. 5. I will get him to follow-up with Sarah in about a month's time.   Meds ordered this encounter  Medications  . atorvastatin (LIPITOR) 10 MG tablet    Sig: Take 1 tablet (10 mg total) by mouth daily.    Dispense:  30 tablet    Refill:  3  . citalopram (CELEXA) 20 MG tablet    Sig: Take 1 tablet (20 mg total) by mouth daily.    Dispense:  30 tablet    Refill:  3  . gabapentin (NEURONTIN) 300 MG capsule    Sig: Take 1 capsule (300 mg total) by mouth 4 (four) times daily.    Dispense:  120 capsule    Refill:  3  . metoprolol succinate (TOPROL-XL) 25 MG 24 hr tablet    Sig: Take 1 tablet (25 mg total) by mouth daily.    Dispense:  30 tablet    Refill:  3  . traZODone (DESYREL) 100 MG tablet    Sig: Take 1 tablet (100 mg total) by mouth at bedtime.    Dispense:  30 tablet    Refill:  3  . omeprazole (PRILOSEC) 20 MG capsule    Sig: Take 1 capsule (20 mg total) by mouth daily.    Dispense:  30 capsule    Refill:  3  . Fluticasone-Umeclidin-Vilant (TRELEGY ELLIPTA) 100-62.5-25 MCG/INH AEPB    Sig: Inhale 1 puff into the lungs daily at 12 noon.    Dispense:  1 each    Refill:  3  . albuterol (VENTOLIN HFA) 108 (90 Base) MCG/ACT inhaler    Sig: Inhale 2 puffs into the lungs every 6 (six) hours as needed for wheezing  or shortness of breath.    Dispense:  18 g    Refill:  3    Christian Whitaker 09-05-1985, MD

## 2020-12-21 NOTE — Progress Notes (Signed)
Just moved here from [place] . Was told by a good friend to come see him to help me get better.

## 2020-12-22 LAB — COMPLETE METABOLIC PANEL WITH GFR
AG Ratio: 1.6 (calc) (ref 1.0–2.5)
ALT: 12 U/L (ref 9–46)
AST: 14 U/L (ref 10–35)
Albumin: 4.5 g/dL (ref 3.6–5.1)
Alkaline phosphatase (APISO): 92 U/L (ref 35–144)
BUN: 8 mg/dL (ref 7–25)
CO2: 23 mmol/L (ref 20–32)
Calcium: 9.6 mg/dL (ref 8.6–10.3)
Chloride: 104 mmol/L (ref 98–110)
Creat: 0.95 mg/dL (ref 0.70–1.33)
GFR, Est African American: 108 mL/min/{1.73_m2} (ref >=60)
GFR, Est Non African American: 93 mL/min/{1.73_m2} (ref >=60)
Globulin: 2.9 g/dL (calc) (ref 1.9–3.7)
Glucose, Bld: 96 mg/dL (ref 65–99)
Potassium: 5 mmol/L (ref 3.5–5.3)
Sodium: 136 mmol/L (ref 135–146)
Total Bilirubin: 0.3 mg/dL (ref 0.2–1.2)
Total Protein: 7.4 g/dL (ref 6.1–8.1)

## 2020-12-22 LAB — PSA, TOTAL WITH REFLEX TO PSA, FREE: PSA, Total: 1.4 ng/mL (ref <=4.0)

## 2020-12-22 LAB — CBC
HCT: 49.2 % (ref 38.5–50.0)
Hemoglobin: 16.6 g/dL (ref 13.2–17.1)
MCH: 33.1 pg — ABNORMAL HIGH (ref 27.0–33.0)
MCHC: 33.7 g/dL (ref 32.0–36.0)
MCV: 98 fL (ref 80.0–100.0)
MPV: 10.9 fL (ref 7.5–12.5)
Platelets: 311 10*3/uL (ref 140–400)
RBC: 5.02 10*6/uL (ref 4.20–5.80)
RDW: 12.4 % (ref 11.0–15.0)
WBC: 9 10*3/uL (ref 3.8–10.8)

## 2020-12-22 LAB — LIPID PANEL
Cholesterol: 124 mg/dL (ref <200)
HDL: 49 mg/dL (ref >=40)
LDL Cholesterol (Calc): 57 mg/dL (calc)
Non-HDL Cholesterol (Calc): 75 mg/dL (calc) (ref <130)
Total CHOL/HDL Ratio: 2.5 (calc) (ref <5.0)
Triglycerides: 98 mg/dL (ref <150)

## 2020-12-22 LAB — TSH: TSH: 1.4 mIU/L (ref 0.40–4.50)

## 2020-12-22 LAB — VITAMIN D 25 HYDROXY (VIT D DEFICIENCY, FRACTURES): Vit D, 25-Hydroxy: 27 ng/mL — ABNORMAL LOW (ref 30–100)

## 2020-12-26 ENCOUNTER — Encounter (HOSPITAL_COMMUNITY): Payer: Self-pay | Admitting: Emergency Medicine

## 2020-12-26 ENCOUNTER — Emergency Department (HOSPITAL_COMMUNITY): Payer: Medicaid Other

## 2020-12-26 ENCOUNTER — Emergency Department (HOSPITAL_COMMUNITY)
Admission: EM | Admit: 2020-12-26 | Discharge: 2020-12-26 | Disposition: A | Discharge status: Home / Self Care | Payer: Medicaid Other | Attending: Emergency Medicine | Admitting: Emergency Medicine

## 2020-12-26 ENCOUNTER — Other Ambulatory Visit: Payer: Self-pay

## 2020-12-26 DIAGNOSIS — S4992XA Unspecified injury of left shoulder and upper arm, initial encounter: Secondary | ICD-10-CM | POA: Diagnosis present

## 2020-12-26 DIAGNOSIS — J449 Chronic obstructive pulmonary disease, unspecified: Secondary | ICD-10-CM | POA: Insufficient documentation

## 2020-12-26 DIAGNOSIS — F17210 Nicotine dependence, cigarettes, uncomplicated: Secondary | ICD-10-CM | POA: Insufficient documentation

## 2020-12-26 DIAGNOSIS — Y92410 Unspecified street and highway as the place of occurrence of the external cause: Secondary | ICD-10-CM | POA: Insufficient documentation

## 2020-12-26 DIAGNOSIS — Z7951 Long term (current) use of inhaled steroids: Secondary | ICD-10-CM | POA: Insufficient documentation

## 2020-12-26 DIAGNOSIS — Z79899 Other long term (current) drug therapy: Secondary | ICD-10-CM | POA: Insufficient documentation

## 2020-12-26 DIAGNOSIS — S60512A Abrasion of left hand, initial encounter: Secondary | ICD-10-CM | POA: Diagnosis not present

## 2020-12-26 DIAGNOSIS — S43102A Unspecified dislocation of left acromioclavicular joint, initial encounter: Secondary | ICD-10-CM | POA: Diagnosis not present

## 2020-12-26 DIAGNOSIS — Z23 Encounter for immunization: Secondary | ICD-10-CM | POA: Insufficient documentation

## 2020-12-26 DIAGNOSIS — I10 Essential (primary) hypertension: Secondary | ICD-10-CM | POA: Diagnosis not present

## 2020-12-26 DIAGNOSIS — Y9355 Activity, bike riding: Secondary | ICD-10-CM | POA: Diagnosis not present

## 2020-12-26 DIAGNOSIS — S42125A Nondisplaced fracture of acromial process, left shoulder, initial encounter for closed fracture: Secondary | ICD-10-CM | POA: Insufficient documentation

## 2020-12-26 DIAGNOSIS — Z7982 Long term (current) use of aspirin: Secondary | ICD-10-CM | POA: Insufficient documentation

## 2020-12-26 DIAGNOSIS — S80212A Abrasion, left knee, initial encounter: Secondary | ICD-10-CM | POA: Diagnosis not present

## 2020-12-26 MED ORDER — TETANUS-DIPHTH-ACELL PERTUSSIS 5-2.5-18.5 LF-MCG/0.5 IM SUSY
0.5000 mL | PREFILLED_SYRINGE | Freq: Once | INTRAMUSCULAR | Status: AC
  Administered 2020-12-26: 0.5 mL via INTRAMUSCULAR
  Filled 2020-12-26: qty 0.5

## 2020-12-26 MED ORDER — METOPROLOL SUCCINATE ER 25 MG PO TB24
25.0000 mg | ORAL_TABLET | Freq: Every day | ORAL | Status: DC
  Administered 2020-12-26: 25 mg via ORAL
  Filled 2020-12-26: qty 1

## 2020-12-26 MED ORDER — HYDROCODONE-ACETAMINOPHEN 5-325 MG PO TABS: 1.0000 | ORAL_TABLET | ORAL | 0 refills | Status: DC | PRN

## 2020-12-26 NOTE — ED Notes (Signed)
Pt to Xray at this time
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
## 2020-12-26 NOTE — Discharge Instructions (Addendum)
You do have a fracture in your shoulder joint along with a dislocation of your clavicle bone which is the source of your pain.  You need to wear the sling at all times to protect the shoulder.  Call Dr. Ave Filter who is the orthopedic specialist on-call for [place] today for an appointment as soon as possible for further evaluation and management of this injury.  I have given you also the name of a local  orthopedist if you have difficulty traveling to [facility] to see Dr. Ave Filter, Dr. Romeo Apple here locally may be available to see you for this injury as well.  Do not drive within 4 hours of taking hydrocodone as this medication will make you drowsy, you may take this medicine if needed for pain.  Make sure to get your regular medications filled including your blood pressure medication as your blood pressure is elevated today.

## 2020-12-29 ENCOUNTER — Other Ambulatory Visit: Payer: Self-pay

## 2020-12-29 ENCOUNTER — Encounter: Payer: Self-pay | Admitting: Orthopedic Surgery

## 2020-12-29 ENCOUNTER — Ambulatory Visit: Payer: Medicaid Other | Admitting: Orthopedic Surgery

## 2020-12-29 VITALS — BP 151/95 | HR 100 | Ht 66.0 in

## 2020-12-29 DIAGNOSIS — S43102A Unspecified dislocation of left acromioclavicular joint, initial encounter: Secondary | ICD-10-CM

## 2020-12-29 DIAGNOSIS — S42125A Nondisplaced fracture of acromial process, left shoulder, initial encounter for closed fracture: Secondary | ICD-10-CM | POA: Diagnosis not present

## 2020-12-29 MED ORDER — HYDROCODONE-ACETAMINOPHEN 5-325 MG PO TABS: 1.0000 | ORAL_TABLET | Freq: Four times a day (QID) | ORAL | 0 refills | Status: AC | PRN

## 2020-12-29 NOTE — Patient Instructions (Signed)
Keep left arm in sling, except for hygiene  Smoking can interfere with bone healing  Continue Naproxen for pain  Hydrocodone as needed for severe pain.  We will work to get you off of narcotics by the next visit.
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
## 2021-01-18 ENCOUNTER — Ambulatory Visit: Payer: Medicaid Other | Admitting: Orthopedic Surgery

## 2021-01-20 ENCOUNTER — Ambulatory Visit (INDEPENDENT_AMBULATORY_CARE_PROVIDER_SITE_OTHER): Payer: Medicaid Other | Admitting: Nurse Practitioner

## 2021-01-20 ENCOUNTER — Encounter (INDEPENDENT_AMBULATORY_CARE_PROVIDER_SITE_OTHER): Payer: Self-pay | Admitting: Nurse Practitioner

## 2021-01-20 ENCOUNTER — Other Ambulatory Visit: Payer: Self-pay

## 2021-01-20 VITALS — BP 124/88 | HR 89 | Temp 97.4°F | Ht 66.0 in | Wt 132.4 lb

## 2021-01-20 DIAGNOSIS — E7800 Pure hypercholesterolemia, unspecified: Secondary | ICD-10-CM | POA: Diagnosis not present

## 2021-01-20 DIAGNOSIS — R079 Chest pain, unspecified: Secondary | ICD-10-CM

## 2021-01-20 DIAGNOSIS — I10 Essential (primary) hypertension: Secondary | ICD-10-CM | POA: Diagnosis not present

## 2021-01-20 DIAGNOSIS — F331 Major depressive disorder, recurrent, moderate: Secondary | ICD-10-CM

## 2021-01-20 DIAGNOSIS — J449 Chronic obstructive pulmonary disease, unspecified: Secondary | ICD-10-CM

## 2021-01-20 MED ORDER — BUDESONIDE-FORMOTEROL FUMARATE 160-4.5 MCG/ACT IN AERO
2.0000 | INHALATION_SPRAY | Freq: Two times a day (BID) | RESPIRATORY_TRACT | 3 refills | Status: DC
Start: 2021-01-20 — End: 2021-02-01

## 2021-01-20 NOTE — Progress Notes (Signed)
Subjective:  Patient ID: Christian Whitaker, male    DOB: Dec 31, 1970  Age: 50 y.o. MRN: [id number]  CC:  Chief Complaint  Patient presents with   Follow-up    Feels overwhelmed and very nervous and anxiety   COPD   Hypertension   Hyperlipidemia   Depression      HPI  This patient arrives today for the above.  Chest pain: He has been experiencing some intermittent episodes of chest pain.  They seem to be triggered mostly by anxiety but sometimes when he is over exerting himself he does experience some chest pain and cardiac palpitations.  He does have COPD and is wondering if the chest pain could be related to his breathing as he feels he has been wheezing and a bit more short of breath with the hot weather here recently.  He denies having any chest pain during the office visit today.  COPD: As stated above he does have COPD and he has albuterol inhaler that he uses as needed.  He used to be taking Trelegy but he was unable to continue to afford this medication so he stopped taking it.  He does have [facility].  He does wake up at night coughing.  He does continue to smoke but is trying to cut back.  Hypertension: He continues on metoprolol for treatment of his hypertension and is tolerating medication well.  Hyperlipidemia: He continues on aspirin and atorvastatin.  Last LDL was collected last month it was 57.  Depression: He is experiencing worsening depression and more anxiety recently.  He does follow with psychiatry and they manage his medications.  He denies any suicidal ideations.  Past Medical History:  Diagnosis Date   COPD (chronic obstructive pulmonary disease) (HCC)    Hyperlipidemia    Hypertension       History reviewed. No pertinent family history.  Social History   Social History Narrative   Moved from [place] to [place] 2021.Single,lives alone.Unemployed.   Social History   Tobacco Use   Smoking status: Some Days    Packs/day:  0.50    Pack years: 0.00    Types: Cigarettes   Smokeless tobacco: Former    Types: Chew   Tobacco comments:    did long ago; he only smokewhen he is stressed about something.   Substance Use Topics   Alcohol use: Yes    Comment: occ     Current Meds  Medication Sig   albuterol (PROVENTIL) (2.5 MG/3ML) 0.083% nebulizer solution Inhale into the lungs.   albuterol (VENTOLIN HFA) 108 (90 Base) MCG/ACT inhaler Inhale 2 puffs into the lungs every 6 (six) hours as needed for wheezing or shortness of breath.   aspirin 81 MG EC tablet Take by mouth.   atorvastatin (LIPITOR) 10 MG tablet Take 1 tablet (10 mg total) by mouth daily.   budesonide-formoterol (SYMBICORT) 160-4.5 MCG/ACT inhaler Inhale 2 puffs into the lungs 2 (two) times daily.   citalopram (CELEXA) 20 MG tablet Take 1 tablet (20 mg total) by mouth daily.   ergocalciferol (VITAMIN D2) 1.25 MG (50000 UT) capsule Take 1 capsule by mouth every 7 (seven) days.   gabapentin (NEURONTIN) 300 MG capsule Take 1 capsule (300 mg total) by mouth 4 (four) times daily.   HYDROcodone-acetaminophen (NORCO/VICODIN) 5-325 MG tablet Take 1 tablet by mouth every 4 (four) hours as needed for moderate pain.   HYDROcodone-acetaminophen (NORCO/VICODIN) 5-325 MG tablet Take 1 tablet by mouth every 6 (six) hours as  needed for moderate pain.   metoprolol succinate (TOPROL-XL) 25 MG 24 hr tablet Take 1 tablet (25 mg total) by mouth daily.   mirtazapine (REMERON) 15 MG tablet Take 1 tablet by mouth daily.   omeprazole (PRILOSEC) 20 MG capsule Take 1 capsule (20 mg total) by mouth daily.   potassium chloride SA (KLOR-CON) 20 MEQ tablet Take 1 tablet by mouth daily.   traZODone (DESYREL) 100 MG tablet Take 1 tablet (100 mg total) by mouth at bedtime.   [DISCONTINUED] Fluticasone-Umeclidin-Vilant (TRELEGY ELLIPTA) 100-62.5-25 MCG/INH AEPB Inhale 1 puff into the lungs daily at 12 noon.    ROS:  Review of Systems  Respiratory:  Positive for cough (will wake up  at night). Negative for shortness of breath and wheezing.   Cardiovascular:  Positive for chest pain and palpitations.  Gastrointestinal:  Negative for abdominal pain and blood in stool.  Neurological:  Positive for dizziness. Negative for headaches.  Psychiatric/Behavioral:  Negative for suicidal ideas. The patient is nervous/anxious and has insomnia.     Objective:   Today's Vitals: BP 124/88   Pulse 89   Temp (!) 97.4 F (36.3 C) (Temporal)   Ht 5\' 6"  (1.676 m)   Wt 132 lb 6.4 oz (60.1 kg)   SpO2 97%   BMI 21.37 kg/m  Vitals with BMI 01/20/2021 12/29/2020 12/26/2020  Height 5\' 6"  5\' 6"  -  Weight 132 lbs 6 oz (No Data) -  BMI 21.38 - -  Systolic 124 151 12/28/2020  Diastolic 88 95 111  Pulse 89 100 99     Physical Exam Vitals reviewed.  Constitutional:      Appearance: Normal appearance.  HENT:     Head: Normocephalic and atraumatic.  Cardiovascular:     Rate and Rhythm: Normal rate and regular rhythm.  Pulmonary:     Effort: Pulmonary effort is normal.     Breath sounds: Wheezing present.  Musculoskeletal:     Cervical back: Neck supple.  Skin:    General: Skin is warm and dry.  Neurological:     Mental Status: He is alert and oriented to person, place, and time.  Psychiatric:        Mood and Affect: Mood normal.        Behavior: Behavior normal.        Thought Content: Thought content normal.        Judgment: Judgment normal.       PHQ9 SCORE ONLY 01/20/2021  PHQ-9 Total Score 12     Assessment and Plan   1. Chronic obstructive pulmonary disease, unspecified COPD type (HCC)   2. Chest pain, unspecified type   3. Essential hypertension   4. Hypercholesterolemia   5. Moderate episode of recurrent major depressive disorder (HCC)      Plan: 1.  He will continue taking his albuterol as needed and I will order Symbicort.  We did discuss how to take this medication that he should wash his mouth out after using it to prevent thrush. 2.  I think most likely his  chest pain is related to his anxiety as well as to his COPD, however we will refer him to cardiology to rule out any cardiac etiology. 3.  He will continue taking his metoprolol as prescribed. 4.  He will continue taking his aspirin and atorvastatin. 5.  He will follow-up with psychiatry as scheduled.  We will also refer him to virtual behavioral health counseling for therapeutic sessions.   Tests ordered Orders Placed This Encounter  Procedures   Ambulatory referral to Cardiology      Meds ordered this encounter  Medications   budesonide-formoterol (SYMBICORT) 160-4.5 MCG/ACT inhaler    Sig: Inhale 2 puffs into the lungs 2 (two) times daily.    Dispense:  1 each    Refill:  3    Order Specific Question:   Supervising Provider    Answer:   Wilson Singer [[id number]]    Patient to follow-up in 6 weeks or sooner as needed.  Elenore Paddy, NP

## 2021-01-20 NOTE — Patient Instructions (Signed)
Budesonide; Formoterol Inhalation aerosol What is this medication? BUDESONIDE; FORMOTEROL (byoo DES oh nide; for MOH te rol) inhalation is a combination of 2 drugs to treat asthma and COPD. Formoterol is a bronchodilator that helps keep airways open. Budesonide decreases inflammation in the lungs.Do not use this drug combination for acute asthma attacks or bronchospasm. This medicine may be used for other purposes; ask your health care provider orpharmacist if you have questions. COMMON BRAND NAME(S): Symbicort What should I tell my care team before I take this medication? They need to know if you have any of these conditions: diabetes eye disease, such as glaucoma, cataracts, or blurred vision heart disease high blood pressure immune system problems infection irregular heartbeat or rhythm liver disease osteoporosis, weak bones pheochromocytoma seizures thyroid disease an unusual or allergic reaction to budesonide, formoterol, other medicines, foods, dyes, or preservatives pregnant or trying to get pregnant breast-feeding How should I use this medication? This medicine is inhaled through the mouth. Shake well before using. Rinse your mouth with water after use. Make sure not to swallow the water. Take it as directed on the prescription label. Do not use it more often than directed.Keep taking it unless your health care provider tells you to stop. This drug comes with INSTRUCTIONS FOR USE. Ask your pharmacist for directions on how to use this drug. Read the information carefully. Talk to yourpharmacist or health care provider if you have questions. Talk to your health care provider about the use of this medicine in children. While it may be prescribed to children as young as 6 years for selectedconditions, precautions do apply. Overdosage: If you think you have taken too much of this medicine contact apoison control center or emergency room at once. NOTE: This medicine is only for you. Do  not share this medicine with others. What if I miss a dose? If you miss a dose, use it as soon as you can. If it is almost time for yournext dose, use only that dose. Do not use double or extra doses. What may interact with this medication? Do not take the medicine with any of the following medications: cisapride dronedarone other medicines that contain long-acting beta-2 agonists (LABAs) like arfomoterol, formoterol, indacaterol, olodaterol, salmeterol, vilanterol pimozide thioridazine This medicine may also interact with the following medications: certain antibiotics like clarithromycin, telithromycin certain antivirals for HIV or hepatitis certain heart medicines like atenolol, metoprolol certain medicines for blood pressure, heart disease, irregular heartbeat certain medicines for depression, anxiety, or psychotic disturbances certain medicines for fungal infections like ketoconazole, itraconazole diuretics grapefruit juice MAOIs like Marplan, Nardil, and Parnate mifepristone other medicines that prolong the QT interval (an abnormal heart rhythm) some vaccines steroid medicines like prednisone or cortisone stimulant medicines for attention disorders, weight loss, or to stay awake theophylline This list may not describe all possible interactions. Give your health care provider a list of all the medicines, herbs, non-prescription drugs, or dietary supplements you use. Also tell them if you smoke, drink alcohol, or use illegaldrugs. Some items may interact with your medicine. What should I watch for while using this medication? Visit your health care provider for regular checks on your progress. Tell your health care provider if your symptoms do not start to get better or if they getworse. Follow the plan from your health care provider for treating an acute asthma attack or bronchospasm (wheezing). If your symptoms get worse or do not getbetter, call your health care provider right  away. If you have asthma, you and   your health care provider should develop an Asthma Action Plan that is just for you. Be sure to know what to do if you are in theyellow (asthma is getting worse) or red (medical alert) zones. Do not treat yourself for coughs, colds or allergies without asking your healthcare provider for advice. Some nonprescription medicines can affect this one. This medicine may increase your risk of getting an infection. Call your health care provider for advice if you get a fever, chills, sore throat, or other symptoms of a cold or flu. Do not treat yourself. Try to avoid being around people who are sick. If you have not had the measles or chickenpox vaccines, tell your health care provider right away if you are around someone with theseviruses. Using this medicine for a long time may weaken your bones. The risk of bone fractures may be increased. Talk to your health care provider about your bonehealth. This medicine may slow your child's growth if it is taken for a long time athigh doses. Your health care provider will monitor your child's growth. What side effects may I notice from receiving this medication? Side effects that you should report to your doctor or health care professionalas soon as possible: allergic reactions like skin rash, itching or hives, swelling of the face, lips, or tongue changes in vision eye pain heartbeat rhythm changes (trouble breathing; chest pain; dizziness; fast, irregular heartbeat; feeling faint or lightheaded, falls) high blood sugar (increased hunger, thirst or urination; unusually weak or tired, blurry vision) infection (fever, chills, cough, sore throat, pain or trouble passing urine) muscle cramps, pain thrush (white patches in the mouth or mouth sores) trouble breathing trouble sleeping unusually weak or tired Side effects that usually do not require medical attention (report these toyour doctor or health care professional if they  continue or are bothersome): back pain changes in taste headache runny or stuffy nose tremors upset stomach This list may not describe all possible side effects. Call your doctor for medical advice about side effects. You may report side effects to FDA at1-800-FDA-1088. Where should I keep my medication? Keep out of the reach of children and pets. Store at room temperature between 20 and 25 degrees C (68 and 77 degrees F). Keep inhaler away from extreme heat, cold or humidity. This medicine is flammable. Avoid exposure to heat, fire, flame, and smoking. Throw away 3 months after removing it from the foil pouch, when the dose counter reads "0"or after the expiration date, whichever is first. To get rid of medicines that are no longer needed or have expired: Take the medicine to a medicine take-back program. Check with your pharmacy or law enforcement to find a location. If you cannot return the medicine, ask your pharmacist or health care provider how to get rid of this medicine safely. NOTE: This sheet is a summary. It may not cover all possible information. If you have questions about this medicine, talk to your doctor, pharmacist, orhealth care provider.  2022 Elsevier/Gold Standard (2019-12-31 17:21:33)  

## 2021-01-28 ENCOUNTER — Ambulatory Visit: Payer: Medicaid Other | Admitting: Orthopedic Surgery

## 2021-02-01 ENCOUNTER — Other Ambulatory Visit: Payer: Self-pay

## 2021-02-01 ENCOUNTER — Ambulatory Visit (INDEPENDENT_AMBULATORY_CARE_PROVIDER_SITE_OTHER): Payer: Medicaid Other | Admitting: Internal Medicine

## 2021-02-01 ENCOUNTER — Encounter: Payer: Self-pay | Admitting: Internal Medicine

## 2021-02-01 DIAGNOSIS — F17210 Nicotine dependence, cigarettes, uncomplicated: Secondary | ICD-10-CM | POA: Diagnosis not present

## 2021-02-01 DIAGNOSIS — J449 Chronic obstructive pulmonary disease, unspecified: Secondary | ICD-10-CM | POA: Diagnosis not present

## 2021-02-01 MED ORDER — BREZTRI AEROSPHERE 160-9-4.8 MCG/ACT IN AERO: 2.0000 | INHALATION_SPRAY | Freq: Two times a day (BID) | RESPIRATORY_TRACT | 11 refills | Status: AC

## 2021-02-01 MED ORDER — PREDNISONE 10 MG PO TABS: ORAL_TABLET | ORAL | 0 refills | Status: DC

## 2021-02-01 MED ORDER — BREZTRI AEROSPHERE 160-9-4.8 MCG/ACT IN AERO: 2.0000 | INHALATION_SPRAY | Freq: Two times a day (BID) | RESPIRATORY_TRACT | 0 refills | Status: DC

## 2021-02-01 NOTE — Patient Instructions (Addendum)
Stop the symbicort   We will get last pfts and alpha one status from [facility] A = Automatic = Always=   Breztri Take 2 puffs first thing in am and then another 2 puffs about 12 hours later.    Prednisone 10 mg take  4 each am x 2 days,   2 each am x 2 days,  1 each am x 2 days and stop   Work on inhaler technique:  relax and gently blow all the way out then take a nice smooth full deep breath back in  Hold for up to 5 seconds if you can. Blow out thru nose. Rinse and gargle with water when done.  If mouth or throat bother you at all,  try brushing teeth/gums/tongue with arm and hammer toothpaste/ make a slurry and gargle and spit out.   Plan B = Backup (to supplement plan A, not to replace it) Only use your albuterol inhaler as a rescue medication to be used if you can't catch your breath by resting or doing a relaxed purse lip breathing pattern.  - The less you use it, the better it will work when you need it. - Ok to use the inhaler up to 2 puffs  every 4 hours if you must but call for appointment if use goes up over your usual need - Don't leave home without it !!  (think of it like the spare tire for your car)   Plan C = Crisis (instead of Plan B but only if Plan B stops working) - only use your albuterol nebulizer if you first try Plan B and it fails to help > ok to use the nebulizer up to every 4 hours but if start needing it regularly call for immediate appointment   The key is to stop smoking completely before smoking completely stops you!    Please schedule a follow up office visit in 6-8 weeks, call sooner if needed with pfts on return

## 2021-02-01 NOTE — Assessment & Plan Note (Signed)
4-5 min discussion re active cigarette smoking in addition to office E&M  Ask about tobacco use:   Ongoing  Advise quitting   I took an extended  opportunity with this patient to outline the consequences of continued cigarette use  in airway disorders based on all the data we have from the multiple national lung health studies (perfomed over decades at millions of dollars in cost)  indicating that smoking cessation, not choice of inhalers or physicians, is the most important aspect of his care.   Assess willingness:  Notfully  committed at this point Assist in quit attempt:  Per PCP when ready/ can try patches in meantime = 7 mg strength should be ok Arrange follow up:   Follow up per Primary Care planned

## 2021-02-01 NOTE — Progress Notes (Signed)
Christian Whitaker, male    DOB: March 13, 1971,   MRN: [id number]   Brief patient profile:  29 yowm active smoker with onset of doe 2016 on daily rx since then  previously followed in [facility] on symbicort though preferred Trelegy but not covered and referred to pulmonary clinic in [place]  02/01/2021 by Dr    Karilyn Cota     History of Present Illness  02/01/2021  Pulmonary/ 1st office eval/ Kalyani Maeda / [facility]  Chief Complaint  Patient presents with   Pulmonary Consult    Referred by Dr. Lilly Cove. Pt states having SOB and cough for the past 3 years, worse x 3 months. He gets winded taking out the trash. He states that he wakes up with tightness in his chest. He has cough with clear sputum, worse when he lies down  He is using his albuterol inhaler 4 x daily on average and neb at least once before bed.   Dyspnea:  MMRC3 = can't walk 100 yards even at a slow pace at a flat grade s stopping due to sob   Cough: wakes up each am with cough/ congestion / white mucus  Sleep: at least sev times a week SABA use:as above  02:  none   No obvious patterns in day to day or daytime variability or assoc   purulent sputum or mucus plugs or hemoptysis or cp or chest tightness, subjective wheeze or overt sinus or hb symptoms.  Also denies any obvious fluctuation of symptoms with weather or environmental changes or other aggravating or alleviating factors except as outlined above   No unusual exposure hx or h/o childhood pna/ asthma or knowledge of premature birth.  Current Allergies, Complete Past Medical History, Past Surgical History, Family History, and Social History were reviewed in [employer] record.  ROS  The following are not active complaints unless bolded Hoarseness, sore throat, dysphagia, dental problems, itching, sneezing,  nasal congestion or discharge of excess mucus or purulent secretions, ear ache,   fever, chills, sweats, unintended wt loss or wt gain,  classically pleuritic or exertional cp,  orthopnea pnd or arm/hand swelling  or leg swelling, presyncope, palpitations, abdominal pain, anorexia, nausea, vomiting, diarrhea  or change in bowel habits or change in bladder habits, change in stools or change in urine, dysuria, hematuria,  rash, arthralgias, visual complaints, headache, numbness, weakness or ataxia or problems with walking or coordination,  change in mood or  memory.           Past Medical History:  Diagnosis Date   COPD (chronic obstructive pulmonary disease) (HCC)    Hyperlipidemia    Hypertension     Outpatient Medications Prior to Visit  Medication Sig Dispense Refill   albuterol (PROVENTIL) (2.5 MG/3ML) 0.083% nebulizer solution Inhale into the lungs.     albuterol (VENTOLIN HFA) 108 (90 Base) MCG/ACT inhaler Inhale 2 puffs into the lungs every 6 (six) hours as needed for wheezing or shortness of breath. 18 g 3   aspirin 81 MG EC tablet Take by mouth.     atorvastatin (LIPITOR) 10 MG tablet Take 1 tablet (10 mg total) by mouth daily. 30 tablet 3   budesonide-formoterol (SYMBICORT) 160-4.5 MCG/ACT inhaler Inhale 2 puffs into the lungs 2 (two) times daily. 1 each 3   citalopram (CELEXA) 20 MG tablet Take 1 tablet (20 mg total) by mouth daily. 30 tablet 3   ergocalciferol (VITAMIN D2) 1.25 MG (50000 UT) capsule Take 1 capsule by  mouth every 7 (seven) days.     gabapentin (NEURONTIN) 300 MG capsule Take 1 capsule (300 mg total) by mouth 4 (four) times daily. 120 capsule 3   HYDROcodone-acetaminophen (NORCO/VICODIN) 5-325 MG tablet Take 1 tablet by mouth every 4 (four) hours as needed for moderate pain. 15 tablet 0   HYDROcodone-acetaminophen (NORCO/VICODIN) 5-325 MG tablet Take 1 tablet by mouth every 6 (six) hours as needed for moderate pain. 20 tablet 0   metoprolol succinate (TOPROL-XL) 25 MG 24 hr tablet Take 1 tablet (25 mg total) by mouth daily. 30 tablet 3   mirtazapine (REMERON) 15 MG tablet Take 1 tablet by mouth daily.      omeprazole (PRILOSEC) 20 MG capsule Take 1 capsule (20 mg total) by mouth daily. 30 capsule 3   potassium chloride SA (KLOR-CON) 20 MEQ tablet Take 1 tablet by mouth daily.     traZODone (DESYREL) 100 MG tablet Take 1 tablet (100 mg total) by mouth at bedtime. 30 tablet 3   No facility-administered medications prior to visit.     Objective:     BP (!) 152/84 (BP Location: Left Arm, Cuff Size: Normal)   Pulse 86   Temp 98.9 F (37.2 C) (Temporal)   SpO2 98% Comment: on RA  SpO2: 98 % (on RA)  Amb wm >>> stated age chronically ill appearing  HEENT : pt wearing mask not removed for exam due to covid -19 concerns.    NECK :  without JVD/Nodes/TM/ nl carotid upstrokes bilaterally   LUNGS: no acc muscle use,  Mod barrel  contour chest wall with bilateral  Distant bs s audible wheeze and  without cough on insp or exp maneuvers and mod  Hyperresonant  to  percussion bilaterally     CV:  RRR  no s3 or murmur or increase in P2, and no edema   ABD:  soft and nontender with pos mid insp Hoover's  in the supine position. No bruits or organomegaly appreciated, bowel sounds nl  MS:     ext warm without deformities, calf tenderness, cyanosis - Moderate clubbing  No obvious joint restrictions   SKIN: warm and dry without lesions    NEURO:  alert, approp, nl sensorium with  no motor or cerebellar deficits apparent.       CXR PA and Lateral:   02/01/2021 :    I personally reviewed images and agree with radiology impression as follows:    Did not go for cxr as rec     Assessment   COPD GOLD ? / still smoking  Active smoker - PFTs req from [facility] 02/01/2021  - Alpha one phenotype also done in [place] / neg per pt/ req 02/01/2021  - 02/01/2021  After extensive coaching inhaler device,  effectiveness =    90% > try breztri    Group D in terms of symptom/risk and laba/lama/ICS  therefore appropriate rx at this point >>>  breztri approp plus prn saba  Re saba: I spent extra time  with pt today reviewing appropriate use of albuterol for prn use on exertion with the following points: 1) saba is for relief of sob that does not improve by walking a slower pace or resting but rather if the pt does not improve after trying this first. 2) If the pt is convinced, as many are, that saba helps recover from activity faster then it's easy to tell if this is the case by re-challenging : ie stop, take the inhaler, then p 5  minutes try the exact same activity (intensity of workload) that just caused the symptoms and see if they are substantially diminished or not after saba 3) if there is an activity that reproducibly causes the symptoms, try the saba 15 min before the activity on alternate days   If in fact the saba really does help, then fine to continue to use it prn but advised may need to look closer at the maintenance regimen being used to achieve better control of airways disease with exertion.          Each maintenance medication was reviewed in detail including emphasizing most importantly the difference between maintenance and prns and under what circumstances the prns are to be triggered using an action plan format where appropriate.  Total time for H and P, chart review, counseling, reviewing hfa device(s) and generating customized AVS unique to this office visit / same day charting = 45 min           Cigarette smoker 4-5 min discussion re active cigarette smoking in addition to office E&M  Ask about tobacco use:   Ongoing  Advise quitting   I took an extended  opportunity with this patient to outline the consequences of continued cigarette use  in airway disorders based on all the data we have from the multiple national lung health studies (perfomed over decades at millions of dollars in cost)  indicating that smoking cessation, not choice of inhalers or physicians, is the most important aspect of his care.   Assess willingness:  Notfully  committed at this point Assist  in quit attempt:  Per PCP when ready/ can try patches in meantime = 7 mg strength should be ok Arrange follow up:   Follow up per Primary Care planned            Sandrea Hughs, MD 02/01/2021

## 2021-02-01 NOTE — Assessment & Plan Note (Addendum)
Active smoker - PFTs req from [facility] 02/01/2021  - Alpha one phenotype also done in [place] / neg per pt/ req 02/01/2021  - 02/01/2021  After extensive coaching inhaler device,  effectiveness =    90% > try breztri    Group D in terms of symptom/risk and laba/lama/ICS  therefore appropriate rx at this point >>>  breztri approp plus prn saba  Re saba: I spent extra time with pt today reviewing appropriate use of albuterol for prn use on exertion with the following points: 1) saba is for relief of sob that does not improve by walking a slower pace or resting but rather if the pt does not improve after trying this first. 2) If the pt is convinced, as many are, that saba helps recover from activity faster then it's easy to tell if this is the case by re-challenging : ie stop, take the inhaler, then p 5 minutes try the exact same activity (intensity of workload) that just caused the symptoms and see if they are substantially diminished or not after saba 3) if there is an activity that reproducibly causes the symptoms, try the saba 15 min before the activity on alternate days   If in fact the saba really does help, then fine to continue to use it prn but advised may need to look closer at the maintenance regimen being used to achieve better control of airways disease with exertion.          Each maintenance medication was reviewed in detail including emphasizing most importantly the difference between maintenance and prns and under what circumstances the prns are to be triggered using an action plan format where appropriate.  Total time for H and P, chart review, counseling, reviewing hfa device(s) and generating customized AVS unique to this office visit / same day charting = 45 min

## 2021-02-03 ENCOUNTER — Institutional Professional Consult (permissible substitution): Payer: Medicaid Other | Admitting: Internal Medicine

## 2021-02-04 ENCOUNTER — Telehealth: Payer: Self-pay | Admitting: Internal Medicine

## 2021-02-04 MED ORDER — MOMETASONE FURO-FORMOTEROL FUM 200-5 MCG/ACT IN AERO: 2.0000 | INHALATION_SPRAY | Freq: Two times a day (BID) | RESPIRATORY_TRACT | 5 refills | Status: AC

## 2021-02-04 NOTE — Telephone Encounter (Signed)
PA received for [facility]. I attempted to do PA online and was told patient needs to try and fail 2 preferred medications. Patient has tried Symbicort. Patient must try at least one of the following: Dulera or Advair Diskus/HFA.   Dr. Sherene Sires please advise if you are willing to try Elwin Sleight or Advair Diskus/HFA)  Thanks

## 2021-02-04 NOTE — Telephone Encounter (Signed)
After his breztri samples run out,  Ok to try dulera 200 Take 2 puffs first thing in am and then another 2 puffs about 12 hours until returns to ov to sort out whether breztri really made an improvement in any of his symptoms and if not ok to resume symbicort as before

## 2021-02-04 NOTE — Telephone Encounter (Signed)
Called and spoke with Patient.  Dr. Thurston Hole recommendations given. Understanding stated. Dulera 200 prescription sent to requested pharmacy.  Nothing further at this time.

## 2021-02-08 ENCOUNTER — Telehealth: Payer: Self-pay | Admitting: Licensed Clinical Social Worker

## 2021-02-08 NOTE — Telephone Encounter (Signed)
Left message encouraging contact 

## 2021-03-03 ENCOUNTER — Other Ambulatory Visit: Payer: Self-pay

## 2021-03-03 ENCOUNTER — Encounter (INDEPENDENT_AMBULATORY_CARE_PROVIDER_SITE_OTHER): Payer: Self-pay | Admitting: Nurse Practitioner

## 2021-03-03 ENCOUNTER — Ambulatory Visit (INDEPENDENT_AMBULATORY_CARE_PROVIDER_SITE_OTHER): Payer: Medicaid Other | Admitting: Nurse Practitioner

## 2021-03-03 VITALS — BP 138/82 | HR 84 | Temp 97.7°F | Ht 68.0 in | Wt 141.8 lb

## 2021-03-03 DIAGNOSIS — L84 Corns and callosities: Secondary | ICD-10-CM

## 2021-03-03 DIAGNOSIS — J449 Chronic obstructive pulmonary disease, unspecified: Secondary | ICD-10-CM

## 2021-03-03 DIAGNOSIS — E559 Vitamin D deficiency, unspecified: Secondary | ICD-10-CM

## 2021-03-03 DIAGNOSIS — R079 Chest pain, unspecified: Secondary | ICD-10-CM

## 2021-03-03 DIAGNOSIS — F17210 Nicotine dependence, cigarettes, uncomplicated: Secondary | ICD-10-CM

## 2021-03-03 DIAGNOSIS — E7800 Pure hypercholesterolemia, unspecified: Secondary | ICD-10-CM

## 2021-03-03 DIAGNOSIS — I10 Essential (primary) hypertension: Secondary | ICD-10-CM

## 2021-03-03 MED ORDER — NICOTINE 21-14-7 MG/24HR TD KIT: 1.0000 | PACK | Freq: Every day | TRANSDERMAL | 0 refills | Status: AC

## 2021-03-03 MED ORDER — ERGOCALCIFEROL 1.25 MG (50000 UT) PO CAPS: 1.0000 | ORAL_CAPSULE | ORAL | 2 refills | Status: AC

## 2021-03-03 NOTE — Patient Instructions (Signed)
Call the heart doctor's office to schedule an appointment for evaluation of chest pain. The heart doctor is Dr. Wyline Mood and the office phone number is: [phone]

## 2021-03-03 NOTE — Progress Notes (Signed)
Subjective:  Patient ID: Christian Whitaker, male    DOB: [birth year]  Age: 50 y.o. MRN: [id number]  CC:  Chief Complaint  Patient presents with   Chest Pain   COPD   Other    Vitamin D Deficiency, smoking cessation   Hypertension   Hyperlipidemia       HPI  This patient arrives today for the above.  Chest pain: He was referred to cardiology by Dr. Anastasio Champion for evaluation of chest pain.  He tells me he has not yet seen the cardiologist.  He tells me he has not had any additional chest pain episodes since last time he was seen in this office.  COPD: He follows with pulmonology and they are managing his COPD.  He tells me that the heat seems to make it harder for him to breathe, but overall his breathing is stable.  He has noticed improvement with his breathing since starting the inhalers as prescribed to him by his pulmonologist.  Vitamin D deficiency: He is out of his vitamin D2 prescription would like a refill of this.  Smoking cessation: He is a current smoker and is interested in quitting.  He would like to try nicotine patches.  Hypertension: He continues on metoprolol and is tolerating his medication well.  Hyperlipidemia: He continues on aspirin and atorvastatin.  Last LDL was 57.  Past Medical History:  Diagnosis Date   COPD (chronic obstructive pulmonary disease) ([place])    Hyperlipidemia    Hypertension       History reviewed. No pertinent family history.  Social History   Social History Narrative   Moved from [place] to [place].Single,lives alone.Unemployed.   Social History   Tobacco Use   Smoking status: Some Days    Packs/day: 0.50    Years: 34.00    Pack years: 17.00    Types: Cigarettes   Smokeless tobacco: Former    Types: Chew   Tobacco comments:    did long ago; he only smokewhen he is stressed about something.   Substance Use Topics   Alcohol use: Yes    Comment: occ     Current Meds  Medication Sig   albuterol  (PROVENTIL) (2.5 MG/3ML) 0.083% nebulizer solution Inhale into the lungs.   albuterol (VENTOLIN HFA) 108 (90 Base) MCG/ACT inhaler Inhale 2 puffs into the lungs every 6 (six) hours as needed for wheezing or shortness of breath.   aspirin 81 MG EC tablet Take by mouth.   atorvastatin (LIPITOR) 10 MG tablet Take 1 tablet (10 mg total) by mouth daily.   Budeson-Glycopyrrol-Formoterol (BREZTRI AEROSPHERE) 160-9-4.8 MCG/ACT AERO Inhale 2 puffs into the lungs 2 (two) times daily.   citalopram (CELEXA) 20 MG tablet Take 1 tablet (20 mg total) by mouth daily.   gabapentin (NEURONTIN) 300 MG capsule Take 1 capsule (300 mg total) by mouth 4 (four) times daily.   HYDROcodone-acetaminophen (NORCO/VICODIN) 5-325 MG tablet Take 1 tablet by mouth every 6 (six) hours as needed for moderate pain.   metoprolol succinate (TOPROL-XL) 25 MG 24 hr tablet Take 1 tablet (25 mg total) by mouth daily.   mirtazapine (REMERON) 15 MG tablet Take 1 tablet by mouth daily.   mometasone-formoterol (DULERA) 200-5 MCG/ACT AERO Inhale 2 puffs into the lungs every 12 (twelve) hours.   Nicotine 21-14-7 MG/24HR KIT Place 1 patch onto the skin daily.   omeprazole (PRILOSEC) 20 MG capsule Take 1 capsule (20 mg total) by mouth daily.   potassium chloride SA (  KLOR-CON) 20 MEQ tablet Take 1 tablet by mouth daily.   traZODone (DESYREL) 100 MG tablet Take 1 tablet (100 mg total) by mouth at bedtime.    ROS:  Review of Systems  Constitutional:  Negative for fever and malaise/fatigue.  Eyes:  Negative for blurred vision.  Respiratory:  Positive for cough, sputum production (stable volume; clear) and shortness of breath.   Cardiovascular:  Negative for chest pain.  Gastrointestinal:  Negative for abdominal pain, blood in stool and melena.    Objective:   Today's Vitals: BP 138/82   Pulse 84   Temp 97.7 F (36.5 C)   Ht '5\' 8"'  (1.727 m)   Wt 141 lb 12.8 oz (64.3 kg)   SpO2 96%   BMI 21.56 kg/m  Vitals with BMI 03/03/2021 02/01/2021  01/20/2021  Height '5\' 8"'  - '5\' 6"'   Weight 141 lbs 13 oz - 132 lbs 6 oz  BMI 45.62 - 56.38  Systolic [phone]  Diastolic 82 84 88  Pulse 84 86 89     Physical Exam Vitals reviewed.  Constitutional:      Appearance: Normal appearance.  HENT:     Head: Normocephalic and atraumatic.  Cardiovascular:     Rate and Rhythm: Normal rate and regular rhythm.  Pulmonary:     Effort: Pulmonary effort is normal.     Breath sounds: Normal breath sounds.  Musculoskeletal:     Cervical back: Neck supple.       Feet:  Feet:     Comments: Callus located on fifth digit. Skin:    General: Skin is warm and dry.  Neurological:     Mental Status: He is alert and oriented to person, place, and time.  Psychiatric:        Mood and Affect: Mood normal.        Behavior: Behavior normal.        Thought Content: Thought content normal.        Judgment: Judgment normal.         Assessment and Plan   1. Vitamin D deficiency disease   2. Corn of foot   3. COPD GOLD  I / still smoking    4. Essential hypertension   5. Chest pain, unspecified type   6. Cigarette nicotine dependence without complication   7. Hypercholesterolemia      Plan: 1.  Refill for vitamin D2 sent to patient's pharmacy. 2.  We will refer him to podiatry. 3.  He will continue to follow-up with pulmonology as scheduled. 4.  He will continue taking his metoprolol as prescribed. 5.  I have given him the phone number to the cardiologist office and have encouraged him to call their office to schedule an appointment. 6.  We will prescribe nicotine patches we have discussed the importance of rotating sites and changing the patch every 24 hours.  He was encouraged to try to quit smoking.  I told him if he does have to smoke a cigarette that he needs to remove his patch first and we discussed the reasons why behind this.  He tells me he understands. 7.  LDL at goal he will continue taking medications as prescribed.   Tests  ordered Orders Placed This Encounter  Procedures   Ambulatory referral to Podiatry      Meds ordered this encounter  Medications   ergocalciferol (VITAMIN D2) 1.25 MG (50000 UT) capsule    Sig: Take 1 capsule (50,000 Units total) by mouth every 7 (  seven) days.    Dispense:  12 capsule    Refill:  2    Order Specific Question:   Supervising Provider    Answer:   Doree Albee [[id number]]   Nicotine 21-14-7 MG/24HR KIT    Sig: Place 1 patch onto the skin daily.    Dispense:  1 kit    Refill:  0    Order Specific Question:   Supervising Provider    Answer:   Doree Albee [[id number]]    Patient to follow-up in 3 months or sooner as needed.  Ailene Ards, NP

## 2021-03-11 ENCOUNTER — Ambulatory Visit: Payer: Medicaid Other

## 2021-03-11 ENCOUNTER — Ambulatory Visit (INDEPENDENT_AMBULATORY_CARE_PROVIDER_SITE_OTHER): Payer: Medicaid Other | Admitting: Orthopedic Surgery

## 2021-03-11 ENCOUNTER — Other Ambulatory Visit: Payer: Self-pay

## 2021-03-11 ENCOUNTER — Encounter: Payer: Self-pay | Admitting: Orthopedic Surgery

## 2021-03-11 VITALS — BP 141/93 | HR 80 | Ht 68.0 in | Wt 142.6 lb

## 2021-03-11 DIAGNOSIS — S42125D Nondisplaced fracture of acromial process, left shoulder, subsequent encounter for fracture with routine healing: Secondary | ICD-10-CM | POA: Diagnosis not present

## 2021-03-11 DIAGNOSIS — M25512 Pain in left shoulder: Secondary | ICD-10-CM
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
## 2021-03-11 NOTE — Patient Instructions (Signed)

## 2021-04-06 ENCOUNTER — Ambulatory Visit: Payer: Medicaid Other | Admitting: Internal Medicine

## 2021-04-13 ENCOUNTER — Ambulatory Visit: Payer: Medicaid Other | Admitting: Podiatry

## 2021-04-27 ENCOUNTER — Ambulatory Visit (INDEPENDENT_AMBULATORY_CARE_PROVIDER_SITE_OTHER): Payer: Medicaid Other | Admitting: Podiatry

## 2021-04-27 ENCOUNTER — Encounter: Payer: Self-pay | Admitting: Podiatry

## 2021-04-27 ENCOUNTER — Other Ambulatory Visit: Payer: Self-pay

## 2021-04-27 DIAGNOSIS — M2042 Other hammer toe(s) (acquired), left foot: Secondary | ICD-10-CM | POA: Diagnosis not present

## 2021-04-27 DIAGNOSIS — M2041 Other hammer toe(s) (acquired), right foot: Secondary | ICD-10-CM

## 2021-04-27 DIAGNOSIS — L84 Corns and callosities: Secondary | ICD-10-CM | POA: Diagnosis not present

## 2021-04-27 NOTE — Progress Notes (Signed)
This patient presents to the office with painful corns on the inside of his fifth toenails  both feet.  He says this problem has present for 2-3 months.  He has provided no self treatment or professional help.  He presents for evaluation and treatment.  Vascular  Dorsalis pedis and posterior tibial pulses are palpable  B/L.  Capillary return  WNL.  Temperature gradient is  WNL.  Skin turgor  WNL  Sensorium  Senn Weinstein monofilament wire  WNL. Normal tactile sensation.  Nail Exam  Patient has normal nails with no evidence of bacterial or fungal infection.  Orthopedic  Exam  Muscle tone and muscle strength  WNL.  No limitations of motion feet  B/L.  No crepitus or joint effusion noted.  Foot type is unremarkable .  Underlapping fifth toes both feet.  Hammer toes fourth toe  B/l. Bony prominences are unremarkable.  Skin  No open lesions.  Normal skin texture and turgor.   Corn fifth toes  B/L.  Hammer toes 4/5  B/L  IE.  Debride corns fifth toe  with # 15 blade.  Discussed condition with patient.  Padding dispensed.  RTC prn   Helane Gunther DPM

## 2021-04-29 ENCOUNTER — Other Ambulatory Visit (HOSPITAL_COMMUNITY)
Admission: RE | Admit: 2021-04-29 | Discharge: 2021-04-29 | Disposition: A | Discharge status: Home / Self Care | Payer: Medicaid Other | Source: Ambulatory Visit | Attending: Internal Medicine | Admitting: Internal Medicine

## 2021-05-03 ENCOUNTER — Ambulatory Visit (HOSPITAL_COMMUNITY): Admission: RE | Admit: 2021-05-03 | Payer: Medicaid Other | Source: Ambulatory Visit

## 2021-05-19 ENCOUNTER — Encounter: Payer: Self-pay | Admitting: Cardiology

## 2021-05-19 ENCOUNTER — Ambulatory Visit: Payer: Medicaid Other | Admitting: Cardiology

## 2021-05-19 ENCOUNTER — Other Ambulatory Visit: Payer: Self-pay

## 2021-05-19 VITALS — BP 148/88 | HR 93 | Ht 68.0 in | Wt 141.0 lb

## 2021-05-19 DIAGNOSIS — R079 Chest pain, unspecified: Secondary | ICD-10-CM

## 2021-05-19 DIAGNOSIS — R0609 Other forms of dyspnea: Secondary | ICD-10-CM | POA: Diagnosis not present

## 2021-05-19 NOTE — Patient Instructions (Signed)
Medication Instructions:  Your physician recommends that you continue on your current medications as directed. Please refer to the Current Medication list given to you today.  *If you need a refill on your cardiac medications before your next appointment, please call your pharmacy*   Lab Work: None If you have labs (blood work) drawn today and your tests are completely normal, you will receive your results only by: MyChart Message (if you have MyChart) OR A paper copy in the mail If you have any lab test that is abnormal or we need to change your treatment, we will call you to review the results.   Testing/Procedures: Your physician has requested that you have a lexiscan myoview. For further information please visit [email or web address]. Please follow instruction sheet, as given.    Follow-Up: At [facility], you and your health needs are our priority.  As part of our continuing mission to provide you with exceptional heart care, we have created designated Provider Care Teams.  These Care Teams include your primary Cardiologist (physician) and Advanced Practice Providers (APPs -  Physician Assistants and Nurse Practitioners) who all work together to provide you with the care you need, when you need it.  We recommend signing up for the patient portal called "MyChart".  Sign up information is provided on this After Visit Summary.  MyChart is used to connect with patients for Virtual Visits (Telemedicine).  Patients are able to view lab/test results, encounter notes, upcoming appointments, etc.  Non-urgent messages can be sent to your provider as well.   To learn more about what you can do with MyChart, go to [email or web address].    Your next appointment:   Follow Up- Pending  Other Instructions    

## 2021-05-19 NOTE — Progress Notes (Signed)
Clinical Summary Christian Whitaker is a 50 y.o.male seen today as a new consult, referred by NP Pearline Cables for the following medical problems.     1.Chest pain 02/2018 Note from Dr Kandice Moos cardiology [facility] nuclear study - 01/03/18 nml Echocardiogram. 01/03/18 EF 60%  - chest pain started 1 month ago - squeezing feeling across entire chest, like someone sitting on it. Often comes on with stress. +SOB. Pain lasts about 30 minutes. Can be worst with deep breathing - occurs roughly about once month. - no relation to food - sedentary lifestyle. Can have DOE with walking dogs which is some what new  CAD risk factors: smoking off and on x 30 years, HTN, HL, father MI late 65s  - cannot run on treadmill due to chronic lower back pain.     2. COPD   3. HTN - followed by pcp  4. Hyperlipidemia - followed by pcp  Past Medical History:  Diagnosis Date   COPD (chronic obstructive pulmonary disease) (HCC)    Hyperlipidemia    Hypertension      Allergies  Allergen Reactions   Poison Oak Extract Rash     Current Outpatient Medications  Medication Sig Dispense Refill   albuterol (PROVENTIL) (2.5 MG/3ML) 0.083% nebulizer solution Inhale into the lungs.     albuterol (VENTOLIN HFA) 108 (90 Base) MCG/ACT inhaler Inhale 2 puffs into the lungs every 6 (six) hours as needed for wheezing or shortness of breath. 18 g 3   aspirin 81 MG EC tablet Take by mouth.     atorvastatin (LIPITOR) 10 MG tablet Take 1 tablet (10 mg total) by mouth daily. 30 tablet 3   Budeson-Glycopyrrol-Formoterol (BREZTRI AEROSPHERE) 160-9-4.8 MCG/ACT AERO Inhale 2 puffs into the lungs 2 (two) times daily. 10.7 g 11   citalopram (CELEXA) 20 MG tablet Take 1 tablet (20 mg total) by mouth daily. 30 tablet 3   ergocalciferol (VITAMIN D2) 1.25 MG (50000 UT) capsule Take 1 capsule (50,000 Units total) by mouth every 7 (seven) days. 12 capsule 2   gabapentin (NEURONTIN) 300 MG capsule Take 1 capsule (300 mg total) by mouth 4  (four) times daily. 120 capsule 3   HYDROcodone-acetaminophen (NORCO/VICODIN) 5-325 MG tablet Take 1 tablet by mouth every 6 (six) hours as needed for moderate pain. 20 tablet 0   metoprolol succinate (TOPROL-XL) 25 MG 24 hr tablet Take 1 tablet (25 mg total) by mouth daily. 30 tablet 3   mirtazapine (REMERON) 15 MG tablet Take 1 tablet by mouth daily.     mometasone-formoterol (DULERA) 200-5 MCG/ACT AERO Inhale 2 puffs into the lungs every 12 (twelve) hours. 13 g 5   Nicotine 21-14-7 MG/24HR KIT Place 1 patch onto the skin daily. 1 kit 0   omeprazole (PRILOSEC) 20 MG capsule Take 1 capsule (20 mg total) by mouth daily. 30 capsule 3   potassium chloride SA (KLOR-CON) 20 MEQ tablet Take 1 tablet by mouth daily.     traZODone (DESYREL) 100 MG tablet Take 1 tablet (100 mg total) by mouth at bedtime. 30 tablet 3   No current facility-administered medications for this visit.     No past surgical history on file.   Allergies  Allergen Reactions   Poison Oak Extract Rash      No family history on file.   Social History Christian Whitaker reports that he has been smoking cigarettes. He has a 17.00 pack-year smoking history. He has quit using smokeless tobacco.  His smokeless tobacco use  included chew. Christian Whitaker reports current alcohol use.   Review of Systems CONSTITUTIONAL: No weight loss, fever, chills, weakness or fatigue.  HEENT: Eyes: No visual loss, blurred vision, double vision or yellow sclerae.No hearing loss, sneezing, congestion, runny nose or sore throat.  SKIN: No rash or itching.  CARDIOVASCULAR: per hpi RESPIRATORY: No shortness of breath, cough or sputum.  GASTROINTESTINAL: No anorexia, nausea, vomiting or diarrhea. No abdominal pain or blood.  GENITOURINARY: No burning on urination, no polyuria NEUROLOGICAL: No headache, dizziness, syncope, paralysis, ataxia, numbness or tingling in the extremities. No change in bowel or bladder control.  MUSCULOSKELETAL: No muscle, back  pain, joint pain or stiffness.  LYMPHATICS: No enlarged nodes. No history of splenectomy.  PSYCHIATRIC: No history of depression or anxiety.  ENDOCRINOLOGIC: No reports of sweating, cold or heat intolerance. No polyuria or polydipsia.  Marland Kitchen   Physical Examination Today's Vitals   05/19/21 1457  BP: (!) 148/88  Pulse: 93  SpO2: 98%  Weight: 141 lb (64 kg)  Height: '5\' 8"'  (1.727 m)   Body mass index is 21.44 kg/m.  Gen: resting comfortably, no acute distress HEENT: no scleral icterus, pupils equal round and reactive, no palptable cervical adenopathy,  CV: RRR, no m/rg no jvd Resp: Clear to auscultation bilaterally GI: abdomen is soft, non-tender, non-distended, normal bowel sounds, no hepatosplenomegaly MSK: extremities are warm, no edema.  Skin: warm, no rash Neuro:  no focal deficits Psych: appropriate affect   Diagnostic Studies  12/2017 echo [facility] cardiologist  Left Ventricle: Estimated left ventricular ejection fraction is 61 -  65%. No regional wall motion abnormality noted.   Left Ventricle  Normal cavity size, wall thickness, systolic function (ejection fraction normal) and diastolic function. The estimated ejection fraction is 61 - 65%. No regional wall motion abnormality noted.   Right Ventricle  Normal cavity size, wall thickness and global systolic function.   Left Atrium  Normal cavity size. Left Atrium volume index is 15.51 mL/m2. No atrial septal defect present.   Right Atrium  Normal cavity size.   IVC/SVC  Normal structure.   Mitral Valve  Normal valve structure, no stenosis and no regurgitation.   Tricuspid Valve  Normal valve structure and no stenosis. Trace tricuspid valve regurgitation.   Aortic Valve  Normal valve structure, trileaflet valve structure, no stenosis and no regurgitation.   Pulmonic Valve  Pulmonic valve not well visualized, but normal doppler findings. No stenosis.   Ascending Aorta  Normal aortic root.   Pericardium  No  evidence of pericardial effusion.   TTE Procedure Information  Image quality: adequate. The view(s) performed were parasternal, apical, subcostal and suprasternal. Color flow Doppler was performed and pulse wave and/or continuous wave Doppler was performed. No contrast was given.     12/2017 Nuclear Stress [place]  Gated SPECT: Left ventricular function post-stress was normal.  Calculated ejection fraction is 59%. There is no evidence of transient  ischemic dilation (TID).   Baseline ECG: Normal sinus rhythm.   Negative stress test.   Left ventricular perfusion is normal.   Myocardial perfusion imaging defect 1: There is a defect that is small  to moderate in size present in the inferior location(s) that is  predominantly fixed. The defect appears to be an artifact.   Normal myocardial perfusion imaging. Myocardial perfusion imaging  supports a low risk stress test.     Assessment and Plan  Chest pain/DOE - unclear etiology - multiple CAD risk factors - he is not able to run  on treadmill due to chronic lower back pain - will plan for lexiscan to further evaluate  EKG SR, RAD , no acute ischemic changes    Arnoldo Lenis, M.D.,

## 2021-05-26 ENCOUNTER — Other Ambulatory Visit: Payer: Self-pay

## 2021-05-26 ENCOUNTER — Ambulatory Visit (HOSPITAL_COMMUNITY)
Admission: RE | Admit: 2021-05-26 | Discharge: 2021-05-26 | Disposition: A | Discharge status: Home / Self Care | Payer: Medicaid Other | Source: Ambulatory Visit | Attending: Cardiology | Admitting: Cardiology

## 2021-05-26 ENCOUNTER — Encounter (HOSPITAL_COMMUNITY): Payer: Self-pay

## 2021-05-26 DIAGNOSIS — R079 Chest pain, unspecified: Secondary | ICD-10-CM

## 2021-05-26 LAB — NM MYOCAR MULTI W/SPECT W/WALL MOTION / EF
LV dias vol: 113 mL (ref 62–150)
LV sys vol: 40 mL
Nuc Stress EF: 65 %
Peak HR: 126 {beats}/min
RATE: 0.3
Rest HR: 68 {beats}/min
Rest Nuclear Isotope Dose: 11 mCi
SDS: 0
SRS: 7
SSS: 7
ST Depression (mm): 0 mm
Stress Nuclear Isotope Dose: 31 mCi
TID: 1

## 2021-05-26 MED ORDER — TECHNETIUM TC 99M TETROFOSMIN IV KIT
30.0000 | PACK | Freq: Once | INTRAVENOUS | Status: AC | PRN
  Administered 2021-05-26: 31 via INTRAVENOUS

## 2021-05-26 MED ORDER — TECHNETIUM TC 99M TETROFOSMIN IV KIT
10.0000 | PACK | Freq: Once | INTRAVENOUS | Status: AC | PRN
  Administered 2021-05-26: 11 via INTRAVENOUS

## 2021-05-26 MED ORDER — SODIUM CHLORIDE FLUSH 0.9 % IV SOLN
INTRAVENOUS | Status: AC
  Administered 2021-05-26: 10 mL via INTRAVENOUS
  Filled 2021-05-26: qty 10

## 2021-05-26 MED ORDER — REGADENOSON 0.4 MG/5ML IV SOLN
INTRAVENOUS | Status: AC
  Administered 2021-05-26: 0.4 mg via INTRAVENOUS
  Filled 2021-05-26: qty 5

## 2021-06-02 ENCOUNTER — Ambulatory Visit (INDEPENDENT_AMBULATORY_CARE_PROVIDER_SITE_OTHER): Payer: Medicaid Other | Admitting: Internal Medicine

## 2021-06-03 ENCOUNTER — Other Ambulatory Visit (HOSPITAL_COMMUNITY): Admission: RE | Admit: 2021-06-03 | Payer: Medicaid Other | Source: Ambulatory Visit

## 2021-06-07 ENCOUNTER — Ambulatory Visit (HOSPITAL_COMMUNITY)
Admission: RE | Admit: 2021-06-07 | Discharge: 2021-06-07 | Disposition: A | Discharge status: Home / Self Care | Payer: Medicaid Other | Source: Ambulatory Visit | Attending: Internal Medicine | Admitting: Internal Medicine

## 2021-06-08 ENCOUNTER — Telehealth: Payer: Self-pay

## 2021-06-08 ENCOUNTER — Ambulatory Visit: Payer: Medicaid Other | Admitting: Internal Medicine

## 2021-06-08 NOTE — Telephone Encounter (Signed)
-----   Message from Antoine Poche, MD sent at 06/08/2021  9:29 AM EDT ----- Stress test overall looks go, no evidence of any active blockages. Can f/u 3 months to reassess symptoms  Dominga Ferry MD

## 2021-06-08 NOTE — Telephone Encounter (Signed)
Pt notified and verbalized understanding. Pt had no questions or concerns at this time. Pt scheduled 09/08/21 at 3:40p with Dominga Ferry, MD. Pt agreeable to time and date.

## 2021-08-03 ENCOUNTER — Ambulatory Visit: Payer: Medicaid Other | Admitting: Internal Medicine

## 2021-08-03 NOTE — Progress Notes (Deleted)
Christian Whitaker, male    DOB: April 02, 1971,   MRN: [id number]   Brief patient profile:  31 yowm active smoker with onset of doe 2016 on daily rx since then  previously followed in [place] on symbicort though preferred Trelegy but not covered and referred to pulmonary clinic in [place]  02/01/2021 by Dr    Anastasio Champion     History of Present Illness  02/01/2021  Pulmonary/ 1st office eval/ Christian Whitaker / [name]  Chief Complaint  Patient presents with   Pulmonary Consult    Referred by Dr. [name]. Pt states having SOB and cough for the past 3 years, worse x 3 months. He gets winded taking out the trash. He states that he wakes up with tightness in his chest. He has cough with clear sputum, worse when he lies down  He is using his albuterol inhaler 4 x daily on average and neb at least once before bed.   Dyspnea:  MMRC3 = can't walk 100 yards even at a slow pace at a flat grade s stopping due to sob   Cough: wakes up each am with cough/ congestion / white mucus  Sleep: at least sev times a week SABA use:as above  02:  none  Rec Stop the symbicort  We will get last pfts and alpha one status from [facility] = Automatic = Always=   Breztri Take 2 puffs first thing in am and then another 2 puffs about 12 hours later.   Prednisone 10 mg take  4 each am x 2 days,   2 each am x 2 days,  1 each am x 2 days and stop  Work on inhaler technique:  Plan B = Backup (to supplement plan A, not to replace it) Only use your albuterol inhaler as a rescue medication  Plan C = Crisis (instead of Plan B but only if Plan B stops working) - only use your albuterol nebulizer if you first try Plan B and it fails to help The key is to stop smoking completely before smoking completely stops you! Please schedule a follow up office visit in 6-8 weeks, call sooner if needed with pfts on return    08/03/2021  f/u ov/[place] office/Christian Whitaker re: GOLD 1 copd  maint on ***  - needs cxr  No chief complaint  on file.   Dyspnea:  *** Cough: *** Sleeping: *** SABA use: *** 02: *** Covid status: *** Lung cancer screening: ***   No obvious day to day or daytime variability or assoc excess/ purulent sputum or mucus plugs or hemoptysis or cp or chest tightness, subjective wheeze or overt sinus or hb symptoms.   *** without nocturnal  or early am exacerbation  of respiratory  c/o's or need for noct saba. Also denies any obvious fluctuation of symptoms with weather or environmental changes or other aggravating or alleviating factors except as outlined above   No unusual exposure hx or h/o childhood pna/ asthma or knowledge of premature birth.  Current Allergies, Complete Past Medical History, Past Surgical History, Family History, and Social History were reviewed in [employer] record.  ROS  The following are not active complaints unless bolded Hoarseness, sore throat, dysphagia, dental problems, itching, sneezing,  nasal congestion or discharge of excess mucus or purulent secretions, ear ache,   fever, chills, sweats, unintended wt loss or wt gain, classically pleuritic or exertional cp,  orthopnea pnd or arm/hand swelling  or leg swelling, presyncope, palpitations,  abdominal pain, anorexia, nausea, vomiting, diarrhea  or change in bowel habits or change in bladder habits, change in stools or change in urine, dysuria, hematuria,  rash, arthralgias, visual complaints, headache, numbness, weakness or ataxia or problems with walking or coordination,  change in mood or  memory.        No outpatient medications have been marked as taking for the 08/03/21 encounter (Appointment) with Nyoka Cowden, MD.              Past Medical History:  Diagnosis Date   COPD (chronic obstructive pulmonary disease) (HCC)    Hyperlipidemia    Hypertension       Objective:     Wt Readings from Last 3 Encounters:  05/19/21 141 lb (64 kg)  03/11/21 142 lb 9.6 oz (64.7 kg)  03/03/21 141  lb 12.8 oz (64.3 kg)     Vital signs reviewed  08/03/2021  - Note at rest 02 sats  ***% on ***   General appearance:    ***        Mod barre  - Moderate clubbing***    CXR PA and Lateral:   02/01/2021 :    I personally reviewed images and agree with radiology impression as follows:    Did not go for cxr as rec     Assessment

## 2021-09-08 ENCOUNTER — Ambulatory Visit: Payer: Medicaid Other | Admitting: Cardiology

## 2021-09-08 NOTE — Progress Notes (Deleted)
Clinical Summary Christian Whitaker is a 51 y.o.male  1.Chest pain 02/2018 Note from Dr Kandice Moos cardiology [facility] nuclear study - 01/03/18 nml Echocardiogram. 01/03/18 EF 60%   - chest pain started 1 month ago - squeezing feeling across entire chest, like someone sitting on it. Often comes on with stress. +SOB. Pain lasts about 30 minutes. Can be worst with deep breathing - occurs roughly about once month. - no relation to food - sedentary lifestyle. Can have DOE with walking dogs which is some what new   CAD risk factors: smoking off and on x 30 years, HTN, HL, father MI late 86s   - cannot run on treadmill due to chronic lower back pain.    05/2021 nuclear stress: no ischemia     2. COPD     3. HTN - followed by pcp   4. Hyperlipidemia - followed by pcp Past Medical History:  Diagnosis Date   COPD (chronic obstructive pulmonary disease) (HCC)    Hyperlipidemia    Hypertension      Allergies  Allergen Reactions   Poison Oak Extract Rash     Current Outpatient Medications  Medication Sig Dispense Refill   albuterol (PROVENTIL) (2.5 MG/3ML) 0.083% nebulizer solution Inhale into the lungs.     albuterol (VENTOLIN HFA) 108 (90 Base) MCG/ACT inhaler Inhale 2 puffs into the lungs every 6 (six) hours as needed for wheezing or shortness of breath. 18 g 3   aspirin 81 MG EC tablet Take by mouth.     atorvastatin (LIPITOR) 10 MG tablet Take 1 tablet (10 mg total) by mouth daily. 30 tablet 3   Budeson-Glycopyrrol-Formoterol (BREZTRI AEROSPHERE) 160-9-4.8 MCG/ACT AERO Inhale 2 puffs into the lungs 2 (two) times daily. 10.7 g 11   citalopram (CELEXA) 20 MG tablet Take 1 tablet (20 mg total) by mouth daily. 30 tablet 3   ergocalciferol (VITAMIN D2) 1.25 MG (50000 UT) capsule Take 1 capsule (50,000 Units total) by mouth every 7 (seven) days. 12 capsule 2   gabapentin (NEURONTIN) 300 MG capsule Take 1 capsule (300 mg total) by mouth 4 (four) times daily. 120 capsule 3    HYDROcodone-acetaminophen (NORCO/VICODIN) 5-325 MG tablet Take 1 tablet by mouth every 6 (six) hours as needed for moderate pain. 20 tablet 0   metoprolol succinate (TOPROL-XL) 25 MG 24 hr tablet Take 1 tablet (25 mg total) by mouth daily. 30 tablet 3   mirtazapine (REMERON) 15 MG tablet Take 1 tablet by mouth daily.     mometasone-formoterol (DULERA) 200-5 MCG/ACT AERO Inhale 2 puffs into the lungs every 12 (twelve) hours. 13 g 5   Nicotine 21-14-7 MG/24HR KIT Place 1 patch onto the skin daily. 1 kit 0   omeprazole (PRILOSEC) 20 MG capsule Take 1 capsule (20 mg total) by mouth daily. 30 capsule 3   potassium chloride SA (KLOR-CON) 20 MEQ tablet Take 1 tablet by mouth daily.     traZODone (DESYREL) 100 MG tablet Take 1 tablet (100 mg total) by mouth at bedtime. 30 tablet 3   No current facility-administered medications for this visit.     No past surgical history on file.   Allergies  Allergen Reactions   Poison Oak Extract Rash      Family History  Problem Relation Age of Onset   Hypertension Mother    Hypertension Sister      Social History Mr. Kenna reports that he has been smoking cigarettes. He has a 17.00 pack-year smoking history. He  has quit using smokeless tobacco.  His smokeless tobacco use included chew. Mr. Reth reports current alcohol use of about 4.0 standard drinks per week.   Review of Systems CONSTITUTIONAL: No weight loss, fever, chills, weakness or fatigue.  HEENT: Eyes: No visual loss, blurred vision, double vision or yellow sclerae.No hearing loss, sneezing, congestion, runny nose or sore throat.  SKIN: No rash or itching.  CARDIOVASCULAR:  RESPIRATORY: No shortness of breath, cough or sputum.  GASTROINTESTINAL: No anorexia, nausea, vomiting or diarrhea. No abdominal pain or blood.  GENITOURINARY: No burning on urination, no polyuria NEUROLOGICAL: No headache, dizziness, syncope, paralysis, ataxia, numbness or tingling in the extremities. No change in  bowel or bladder control.  MUSCULOSKELETAL: No muscle, back pain, joint pain or stiffness.  LYMPHATICS: No enlarged nodes. No history of splenectomy.  PSYCHIATRIC: No history of depression or anxiety.  ENDOCRINOLOGIC: No reports of sweating, cold or heat intolerance. No polyuria or polydipsia.  Marland Kitchen   Physical Examination There were no vitals filed for this visit. There were no vitals filed for this visit.  Gen: resting comfortably, no acute distress HEENT: no scleral icterus, pupils equal round and reactive, no palptable cervical adenopathy,  CV Resp: Clear to auscultation bilaterally GI: abdomen is soft, non-tender, non-distended, normal bowel sounds, no hepatosplenomegaly MSK: extremities are warm, no edema.  Skin: warm, no rash Neuro:  no focal deficits Psych: appropriate affect   Diagnostic Studies   12/2017 echo [facility] cardiologist  Left Ventricle: Estimated left ventricular ejection fraction is 61 -  65%. No regional wall motion abnormality noted.   Left Ventricle  Normal cavity size, wall thickness, systolic function (ejection fraction normal) and diastolic function. The estimated ejection fraction is 61 - 65%. No regional wall motion abnormality noted.   Right Ventricle  Normal cavity size, wall thickness and global systolic function.   Left Atrium  Normal cavity size. Left Atrium volume index is 15.51 mL/m2. No atrial septal defect present.   Right Atrium  Normal cavity size.   IVC/SVC  Normal structure.   Mitral Valve  Normal valve structure, no stenosis and no regurgitation.   Tricuspid Valve  Normal valve structure and no stenosis. Trace tricuspid valve regurgitation.   Aortic Valve  Normal valve structure, trileaflet valve structure, no stenosis and no regurgitation.   Pulmonic Valve  Pulmonic valve not well visualized, but normal doppler findings. No stenosis.   Ascending Aorta  Normal aortic root.   Pericardium  No evidence of pericardial  effusion.   TTE Procedure Information  Image quality: adequate. The view(s) performed were parasternal, apical, subcostal and suprasternal. Color flow Doppler was performed and pulse wave and/or continuous wave Doppler was performed. No contrast was given.        12/2017 Nuclear Stress [place]  Gated SPECT: Left ventricular function post-stress was normal.  Calculated ejection fraction is 59%. There is no evidence of transient  ischemic dilation (TID).   Baseline ECG: Normal sinus rhythm.   Negative stress test.   Left ventricular perfusion is normal.   Myocardial perfusion imaging defect 1: There is a defect that is small  to moderate in size present in the inferior location(s) that is  predominantly fixed. The defect appears to be an artifact.   Normal myocardial perfusion imaging. Myocardial perfusion imaging  supports a low risk stress test.   05/2021 nuclear stress    Findings are equivocal. The study is low risk.   No ST deviation was noted. The ECG was negative for ischemia.  LV perfusion is equivocal.  Moderate sized, apical to basal inferior defect that is fixed and associated with normal wall motion.  Suspect diaphragmatic attenuation, less likely scar.   Left ventricular function is normal. Nuclear stress EF: 65 %.   Low risk study.  Inferior wall defect most consistent with diaphragmatic attenuation, less likely scar.  No large ischemic territories identified.  LVEF 65%. Assessment and Plan   Chest pain/DOE - unclear etiology - multiple CAD risk factors - he is not able to run on treadmill due to chronic lower back pain - will plan for lexiscan to further evaluate   EKG SR, RAD , no acute ischemic changes     Arnoldo Lenis, M.D., F.A.C.C.

## 2022-10-05 ENCOUNTER — Encounter: Payer: Self-pay | Admitting: Radiology

## 2022-10-13 IMAGING — DX DG SHOULDER 2+V*L*
2 series · 2 of 2 positions shown · non-contrast
Comparison: None.

CLINICAL DATA: Fall, left shoulder pain

EXAM:
LEFT SHOULDER - 2+ VIEW

[shoulder grashey]
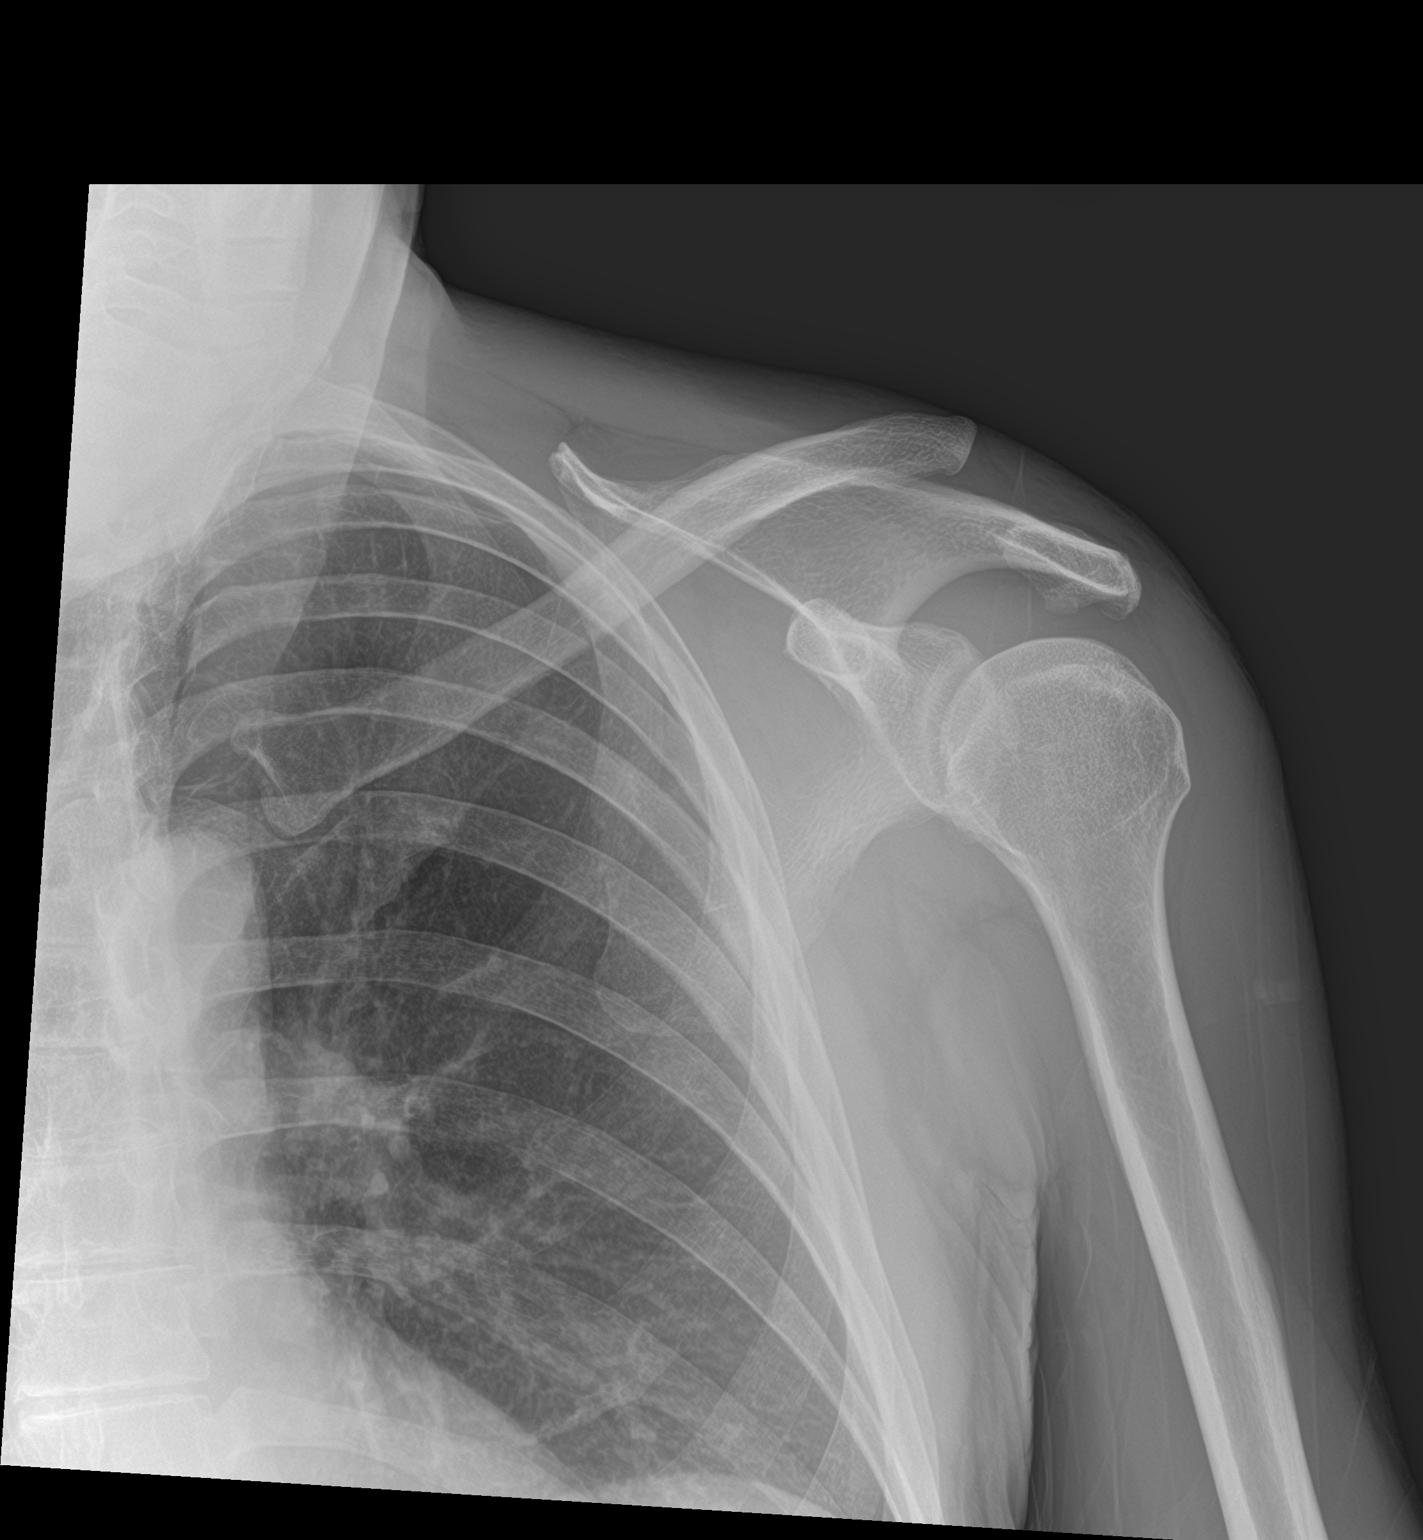

[shoulder y view]
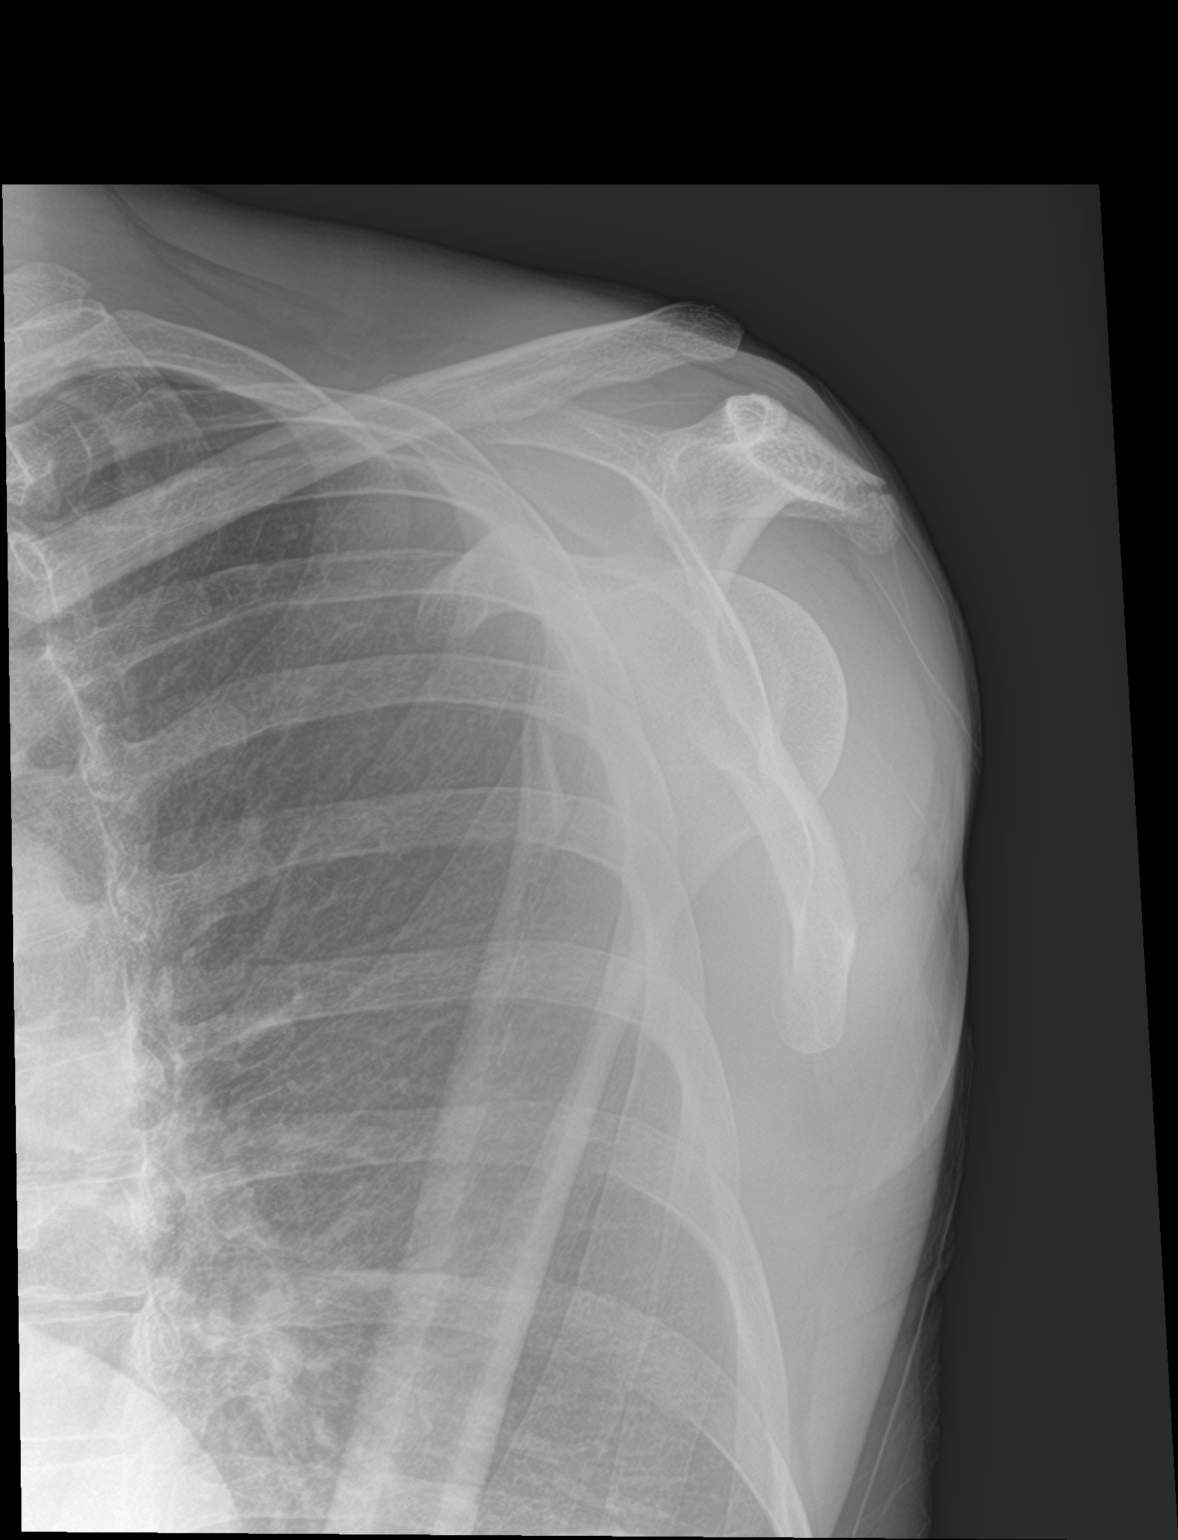

[2 of 2 positions shown; findings below may reference images not displayed]

FINDINGS: There is widening of the AC joint with superior displacement of the
distal clavicle relative to the acromion. Findings compatible with
AC joint separation. Glenohumeral joint is intact. Irregularity
noted within the acromion on the Y-view compatible with acromion
fracture.
IMPRESSION: Left AC joint separation.

Acromion fracture.
